# Patient Record
Sex: Female | Born: 1935
Health system: Southern US, Community
[De-identification: ages and names within clinical notes are randomized; demographics above are authoritative.]

## PROBLEM LIST (undated history)

## (undated) DIAGNOSIS — H409 Unspecified glaucoma: Secondary | ICD-10-CM

## (undated) DIAGNOSIS — H5410 Blindness, one eye, low vision other eye, unspecified eyes: Secondary | ICD-10-CM

## (undated) DIAGNOSIS — E785 Hyperlipidemia, unspecified: Secondary | ICD-10-CM

## (undated) DIAGNOSIS — K219 Gastro-esophageal reflux disease without esophagitis: Secondary | ICD-10-CM

## (undated) DIAGNOSIS — H5440 Blindness, one eye, unspecified eye: Secondary | ICD-10-CM

## (undated) DIAGNOSIS — I10 Essential (primary) hypertension: Secondary | ICD-10-CM

## (undated) DIAGNOSIS — H548 Legal blindness, as defined in USA: Secondary | ICD-10-CM

## (undated) HISTORY — DX: Unspecified glaucoma: H40.9

## (undated) HISTORY — PX: CERVIX LESION DESTRUCTION: SHX591

## (undated) HISTORY — DX: Essential (primary) hypertension: I10

## (undated) HISTORY — DX: Legal blindness, as defined in USA: H54.8

## (undated) HISTORY — PX: APPENDECTOMY: SHX54

## (undated) HISTORY — DX: Hyperlipidemia, unspecified: E78.5

## (undated) HISTORY — DX: Gastro-esophageal reflux disease without esophagitis: K21.9

---
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
## 2002-06-12 HISTORY — PX: ESOPHAGOGASTRODUODENOSCOPY: SHX1529

## 2002-06-12 HISTORY — PX: PARTIAL COLECTOMY: SHX5273

## 2010-10-03 ENCOUNTER — Ambulatory Visit: Payer: Self-pay | Admitting: Internal Medicine

## 2011-01-16 ENCOUNTER — Ambulatory Visit: Payer: Self-pay

## 2011-08-22 DIAGNOSIS — L570 Actinic keratosis: Secondary | ICD-10-CM | POA: Diagnosis not present

## 2011-08-22 DIAGNOSIS — D239 Other benign neoplasm of skin, unspecified: Secondary | ICD-10-CM | POA: Diagnosis not present

## 2011-08-22 DIAGNOSIS — D235 Other benign neoplasm of skin of trunk: Secondary | ICD-10-CM | POA: Diagnosis not present

## 2011-08-22 DIAGNOSIS — L821 Other seborrheic keratosis: Secondary | ICD-10-CM | POA: Diagnosis not present

## 2011-08-22 DIAGNOSIS — Z85828 Personal history of other malignant neoplasm of skin: Secondary | ICD-10-CM | POA: Diagnosis not present

## 2012-02-09 DIAGNOSIS — K219 Gastro-esophageal reflux disease without esophagitis: Secondary | ICD-10-CM | POA: Diagnosis not present

## 2012-02-09 DIAGNOSIS — J329 Chronic sinusitis, unspecified: Secondary | ICD-10-CM | POA: Diagnosis not present

## 2012-03-01 DIAGNOSIS — H4011X0 Primary open-angle glaucoma, stage unspecified: Secondary | ICD-10-CM | POA: Diagnosis not present

## 2012-04-15 IMAGING — CR DG CHEST 2V
1 series · 2 of 2 positions shown · non-contrast
Comparison: none

REASON FOR EXAM: pain
COMMENTS:

[Series 1: view not recorded · 0.17mm/px · 2 of 2 slices shown]
[im 1/2]
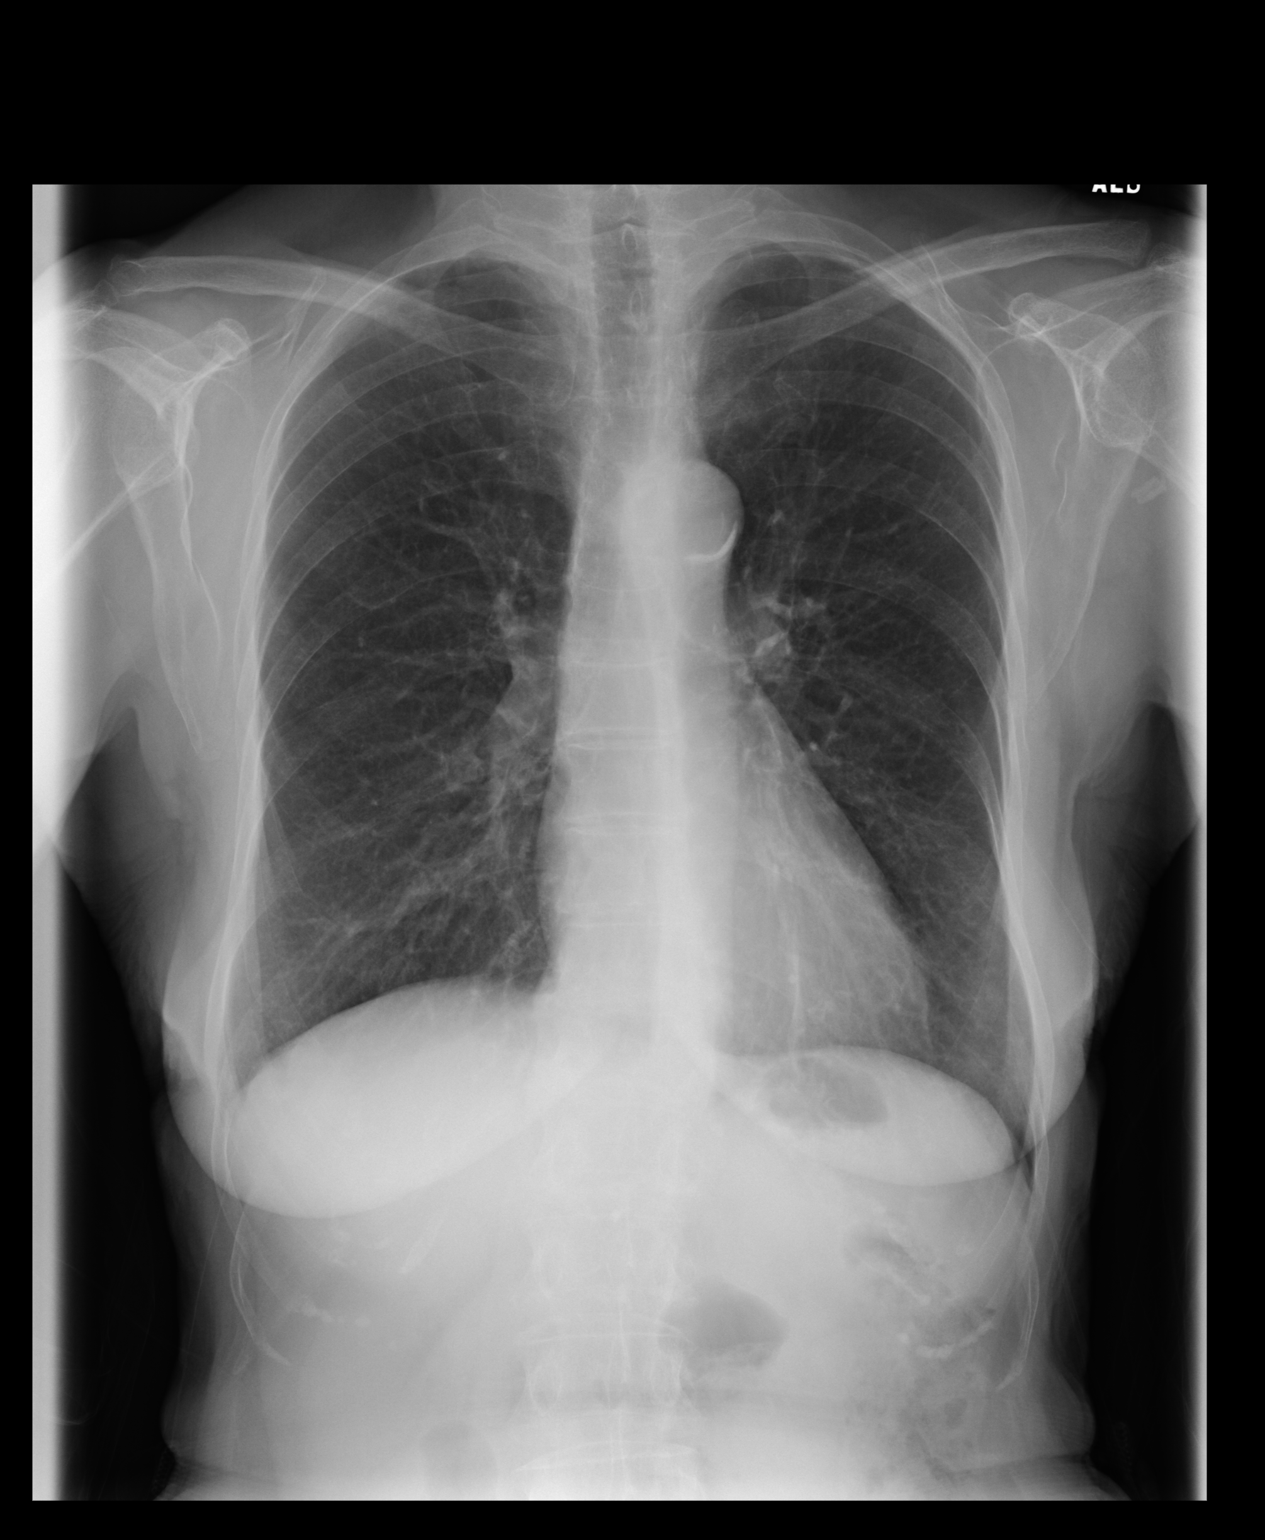
[im 2/2]
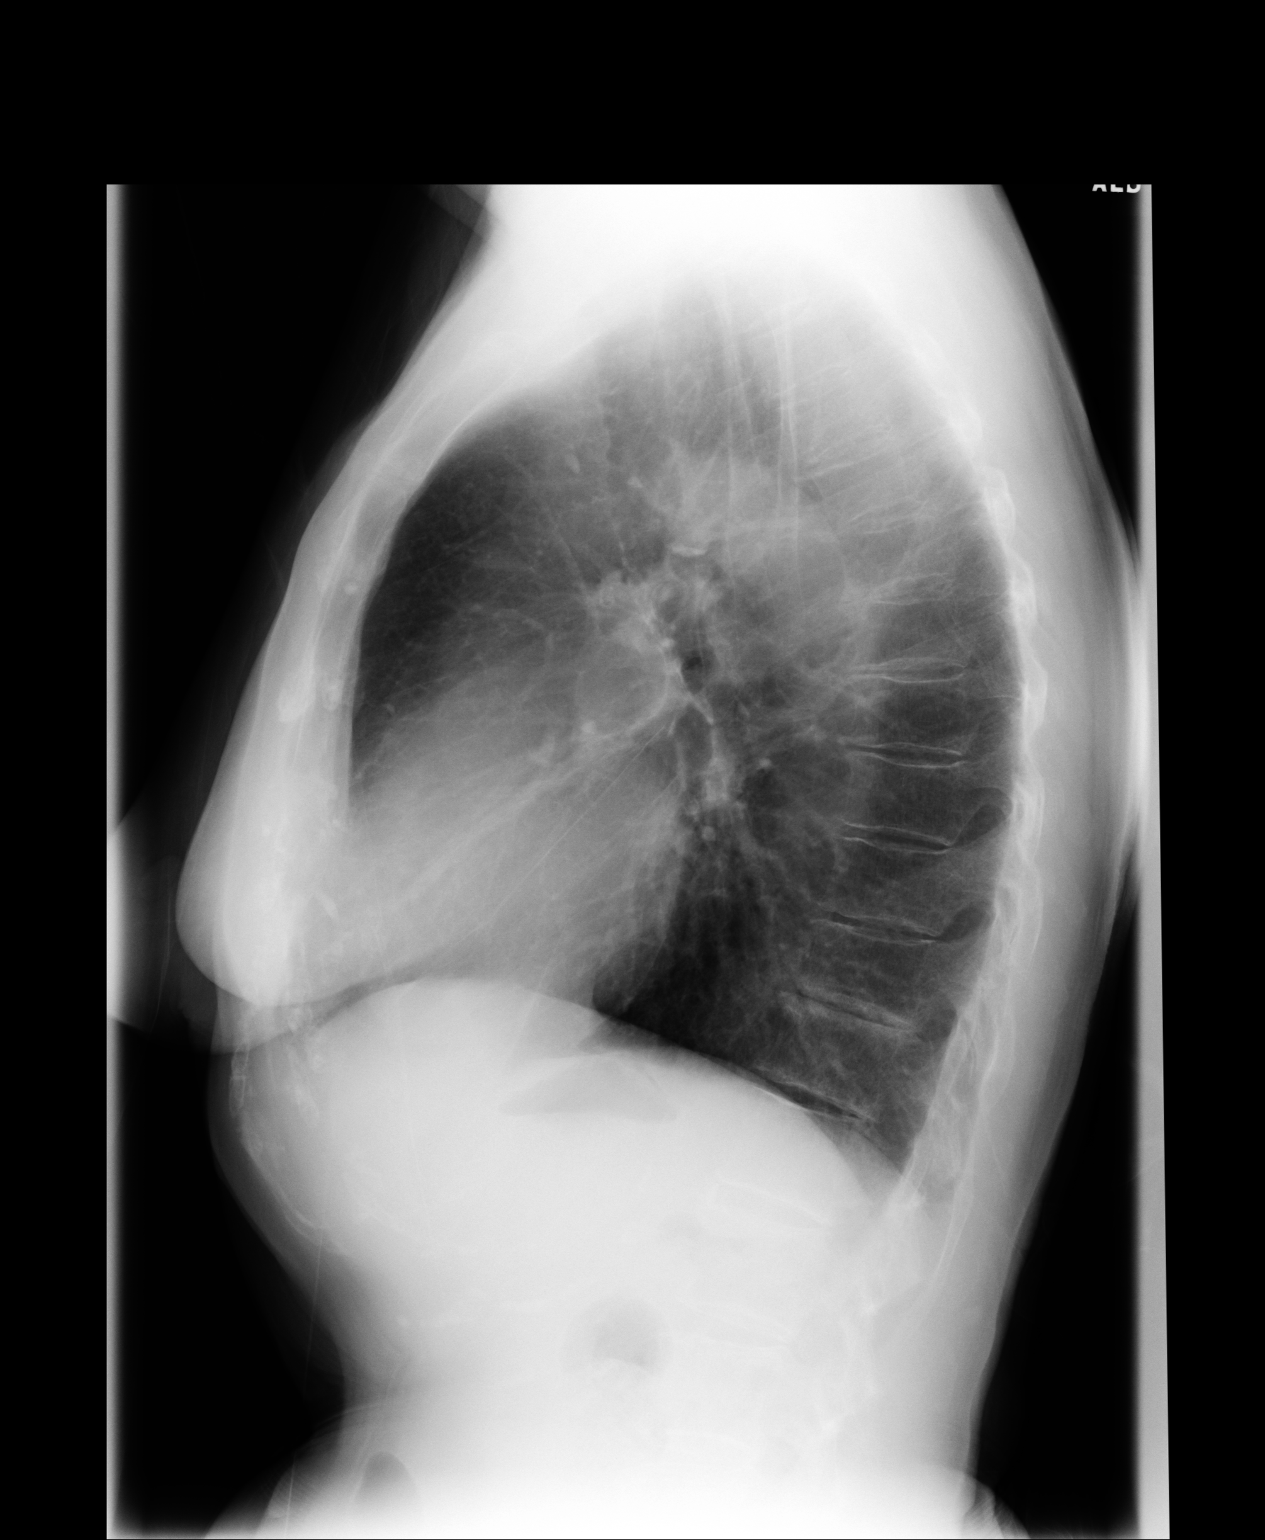

[2 of 2 positions shown; findings below may reference images not displayed]

PROCEDURE:     MDR - MDR CHEST PA(OR AP) AND LATERAL  - October 03, 2010 [DATE]

RESULT:     Calcification is seen within the aortic arch. The lungs are
hyperinflated consistent with COPD. The heart is normal in size. There is no
edema, infiltrate, effusion or pneumothorax. The bony and mediastinal
structures are unremarkable.
IMPRESSION: 1. Atherosclerotic disease.
2. No acute cardiopulmonary disease evident.

## 2012-05-22 DIAGNOSIS — H4011X0 Primary open-angle glaucoma, stage unspecified: Secondary | ICD-10-CM | POA: Diagnosis not present

## 2012-05-24 DIAGNOSIS — H35359 Cystoid macular degeneration, unspecified eye: Secondary | ICD-10-CM | POA: Diagnosis not present

## 2012-05-24 DIAGNOSIS — H349 Unspecified retinal vascular occlusion: Secondary | ICD-10-CM | POA: Diagnosis not present

## 2012-05-24 DIAGNOSIS — H409 Unspecified glaucoma: Secondary | ICD-10-CM | POA: Diagnosis not present

## 2012-05-24 DIAGNOSIS — H4010X0 Unspecified open-angle glaucoma, stage unspecified: Secondary | ICD-10-CM | POA: Diagnosis not present

## 2012-05-27 DIAGNOSIS — H348192 Central retinal vein occlusion, unspecified eye, stable: Secondary | ICD-10-CM | POA: Diagnosis not present

## 2012-06-10 DIAGNOSIS — H35039 Hypertensive retinopathy, unspecified eye: Secondary | ICD-10-CM | POA: Diagnosis not present

## 2012-06-10 DIAGNOSIS — H43819 Vitreous degeneration, unspecified eye: Secondary | ICD-10-CM | POA: Diagnosis not present

## 2012-06-10 DIAGNOSIS — H35359 Cystoid macular degeneration, unspecified eye: Secondary | ICD-10-CM | POA: Diagnosis not present

## 2012-06-10 DIAGNOSIS — H409 Unspecified glaucoma: Secondary | ICD-10-CM | POA: Diagnosis not present

## 2012-06-10 DIAGNOSIS — H4010X0 Unspecified open-angle glaucoma, stage unspecified: Secondary | ICD-10-CM | POA: Diagnosis not present

## 2012-06-10 DIAGNOSIS — H348192 Central retinal vein occlusion, unspecified eye, stable: Secondary | ICD-10-CM | POA: Diagnosis not present

## 2012-06-10 DIAGNOSIS — H349 Unspecified retinal vascular occlusion: Secondary | ICD-10-CM | POA: Diagnosis not present

## 2012-06-25 DIAGNOSIS — K219 Gastro-esophageal reflux disease without esophagitis: Secondary | ICD-10-CM | POA: Diagnosis not present

## 2012-06-25 DIAGNOSIS — Z0000 Encounter for general adult medical examination without abnormal findings: Secondary | ICD-10-CM | POA: Diagnosis not present

## 2012-06-25 DIAGNOSIS — E7800 Pure hypercholesterolemia, unspecified: Secondary | ICD-10-CM | POA: Diagnosis not present

## 2012-06-25 DIAGNOSIS — I10 Essential (primary) hypertension: Secondary | ICD-10-CM | POA: Diagnosis not present

## 2012-07-08 DIAGNOSIS — H35359 Cystoid macular degeneration, unspecified eye: Secondary | ICD-10-CM | POA: Diagnosis not present

## 2012-07-08 DIAGNOSIS — H348192 Central retinal vein occlusion, unspecified eye, stable: Secondary | ICD-10-CM | POA: Diagnosis not present

## 2012-08-07 DIAGNOSIS — H348192 Central retinal vein occlusion, unspecified eye, stable: Secondary | ICD-10-CM | POA: Diagnosis not present

## 2012-09-04 DIAGNOSIS — H348192 Central retinal vein occlusion, unspecified eye, stable: Secondary | ICD-10-CM | POA: Diagnosis not present

## 2012-09-04 DIAGNOSIS — H35359 Cystoid macular degeneration, unspecified eye: Secondary | ICD-10-CM | POA: Diagnosis not present

## 2012-09-05 DIAGNOSIS — H348192 Central retinal vein occlusion, unspecified eye, stable: Secondary | ICD-10-CM | POA: Diagnosis not present

## 2012-09-25 DIAGNOSIS — L570 Actinic keratosis: Secondary | ICD-10-CM | POA: Diagnosis not present

## 2012-09-25 DIAGNOSIS — L819 Disorder of pigmentation, unspecified: Secondary | ICD-10-CM | POA: Diagnosis not present

## 2012-09-25 DIAGNOSIS — L821 Other seborrheic keratosis: Secondary | ICD-10-CM | POA: Diagnosis not present

## 2012-09-25 DIAGNOSIS — L738 Other specified follicular disorders: Secondary | ICD-10-CM | POA: Diagnosis not present

## 2012-10-09 DIAGNOSIS — H409 Unspecified glaucoma: Secondary | ICD-10-CM | POA: Diagnosis not present

## 2012-10-09 DIAGNOSIS — H35359 Cystoid macular degeneration, unspecified eye: Secondary | ICD-10-CM | POA: Diagnosis not present

## 2012-10-09 DIAGNOSIS — H4010X0 Unspecified open-angle glaucoma, stage unspecified: Secondary | ICD-10-CM | POA: Diagnosis not present

## 2012-10-09 DIAGNOSIS — H349 Unspecified retinal vascular occlusion: Secondary | ICD-10-CM | POA: Diagnosis not present

## 2012-10-16 DIAGNOSIS — H35359 Cystoid macular degeneration, unspecified eye: Secondary | ICD-10-CM | POA: Diagnosis not present

## 2012-10-16 DIAGNOSIS — H348192 Central retinal vein occlusion, unspecified eye, stable: Secondary | ICD-10-CM | POA: Diagnosis not present

## 2012-11-18 DIAGNOSIS — I10 Essential (primary) hypertension: Secondary | ICD-10-CM | POA: Diagnosis not present

## 2012-12-04 DIAGNOSIS — H348192 Central retinal vein occlusion, unspecified eye, stable: Secondary | ICD-10-CM | POA: Diagnosis not present

## 2012-12-04 DIAGNOSIS — H35359 Cystoid macular degeneration, unspecified eye: Secondary | ICD-10-CM | POA: Diagnosis not present

## 2012-12-05 ENCOUNTER — Ambulatory Visit: Payer: Self-pay | Admitting: Internal Medicine

## 2012-12-05 DIAGNOSIS — Z1231 Encounter for screening mammogram for malignant neoplasm of breast: Secondary | ICD-10-CM | POA: Diagnosis not present

## 2012-12-05 LAB — HM MAMMOGRAPHY: HM Mammogram: NORMAL

## 2013-01-08 DIAGNOSIS — H409 Unspecified glaucoma: Secondary | ICD-10-CM | POA: Diagnosis not present

## 2013-01-08 DIAGNOSIS — H4010X0 Unspecified open-angle glaucoma, stage unspecified: Secondary | ICD-10-CM | POA: Diagnosis not present

## 2013-01-08 DIAGNOSIS — H349 Unspecified retinal vascular occlusion: Secondary | ICD-10-CM | POA: Diagnosis not present

## 2013-01-08 DIAGNOSIS — H2510 Age-related nuclear cataract, unspecified eye: Secondary | ICD-10-CM | POA: Diagnosis not present

## 2013-01-29 DIAGNOSIS — H2510 Age-related nuclear cataract, unspecified eye: Secondary | ICD-10-CM | POA: Diagnosis not present

## 2013-01-29 DIAGNOSIS — H348192 Central retinal vein occlusion, unspecified eye, stable: Secondary | ICD-10-CM | POA: Diagnosis not present

## 2013-01-29 DIAGNOSIS — H35039 Hypertensive retinopathy, unspecified eye: Secondary | ICD-10-CM | POA: Diagnosis not present

## 2013-02-17 DIAGNOSIS — I10 Essential (primary) hypertension: Secondary | ICD-10-CM | POA: Diagnosis not present

## 2013-03-14 DIAGNOSIS — H5710 Ocular pain, unspecified eye: Secondary | ICD-10-CM | POA: Diagnosis not present

## 2013-03-14 DIAGNOSIS — H348192 Central retinal vein occlusion, unspecified eye, stable: Secondary | ICD-10-CM | POA: Diagnosis not present

## 2013-03-14 DIAGNOSIS — H4050X0 Glaucoma secondary to other eye disorders, unspecified eye, stage unspecified: Secondary | ICD-10-CM | POA: Diagnosis not present

## 2013-03-17 DIAGNOSIS — H348192 Central retinal vein occlusion, unspecified eye, stable: Secondary | ICD-10-CM | POA: Diagnosis not present

## 2013-03-17 DIAGNOSIS — H4050X0 Glaucoma secondary to other eye disorders, unspecified eye, stage unspecified: Secondary | ICD-10-CM | POA: Diagnosis not present

## 2013-03-19 DIAGNOSIS — H4089 Other specified glaucoma: Secondary | ICD-10-CM | POA: Diagnosis not present

## 2013-03-19 DIAGNOSIS — H35039 Hypertensive retinopathy, unspecified eye: Secondary | ICD-10-CM | POA: Diagnosis not present

## 2013-03-19 DIAGNOSIS — H2510 Age-related nuclear cataract, unspecified eye: Secondary | ICD-10-CM | POA: Diagnosis not present

## 2013-03-19 DIAGNOSIS — H348192 Central retinal vein occlusion, unspecified eye, stable: Secondary | ICD-10-CM | POA: Diagnosis not present

## 2013-03-20 DIAGNOSIS — H4089 Other specified glaucoma: Secondary | ICD-10-CM | POA: Diagnosis not present

## 2013-03-20 DIAGNOSIS — H259 Unspecified age-related cataract: Secondary | ICD-10-CM | POA: Diagnosis not present

## 2013-03-20 DIAGNOSIS — H4011X0 Primary open-angle glaucoma, stage unspecified: Secondary | ICD-10-CM | POA: Diagnosis not present

## 2013-03-20 DIAGNOSIS — H348192 Central retinal vein occlusion, unspecified eye, stable: Secondary | ICD-10-CM | POA: Diagnosis not present

## 2013-03-21 DIAGNOSIS — R110 Nausea: Secondary | ICD-10-CM | POA: Diagnosis not present

## 2013-03-21 DIAGNOSIS — H4011X0 Primary open-angle glaucoma, stage unspecified: Secondary | ICD-10-CM | POA: Diagnosis not present

## 2013-03-21 DIAGNOSIS — H4089 Other specified glaucoma: Secondary | ICD-10-CM | POA: Diagnosis not present

## 2013-03-21 DIAGNOSIS — H409 Unspecified glaucoma: Secondary | ICD-10-CM | POA: Diagnosis not present

## 2013-03-21 DIAGNOSIS — H348192 Central retinal vein occlusion, unspecified eye, stable: Secondary | ICD-10-CM | POA: Diagnosis not present

## 2013-03-21 DIAGNOSIS — I498 Other specified cardiac arrhythmias: Secondary | ICD-10-CM | POA: Diagnosis not present

## 2013-03-21 DIAGNOSIS — H259 Unspecified age-related cataract: Secondary | ICD-10-CM | POA: Diagnosis not present

## 2013-03-26 DIAGNOSIS — H35039 Hypertensive retinopathy, unspecified eye: Secondary | ICD-10-CM | POA: Diagnosis not present

## 2013-03-26 DIAGNOSIS — H4089 Other specified glaucoma: Secondary | ICD-10-CM | POA: Diagnosis not present

## 2013-03-26 DIAGNOSIS — H348192 Central retinal vein occlusion, unspecified eye, stable: Secondary | ICD-10-CM | POA: Diagnosis not present

## 2013-03-26 DIAGNOSIS — H2510 Age-related nuclear cataract, unspecified eye: Secondary | ICD-10-CM | POA: Diagnosis not present

## 2013-04-09 DIAGNOSIS — H4089 Other specified glaucoma: Secondary | ICD-10-CM | POA: Diagnosis not present

## 2013-04-16 DIAGNOSIS — H348192 Central retinal vein occlusion, unspecified eye, stable: Secondary | ICD-10-CM | POA: Diagnosis not present

## 2013-04-16 DIAGNOSIS — H4089 Other specified glaucoma: Secondary | ICD-10-CM | POA: Diagnosis not present

## 2013-05-14 DIAGNOSIS — H348192 Central retinal vein occlusion, unspecified eye, stable: Secondary | ICD-10-CM | POA: Diagnosis not present

## 2013-05-14 DIAGNOSIS — H4089 Other specified glaucoma: Secondary | ICD-10-CM | POA: Diagnosis not present

## 2013-06-11 DIAGNOSIS — H348192 Central retinal vein occlusion, unspecified eye, stable: Secondary | ICD-10-CM | POA: Diagnosis not present

## 2013-06-11 DIAGNOSIS — H4089 Other specified glaucoma: Secondary | ICD-10-CM | POA: Diagnosis not present

## 2013-07-02 DIAGNOSIS — H2510 Age-related nuclear cataract, unspecified eye: Secondary | ICD-10-CM | POA: Diagnosis not present

## 2013-07-02 DIAGNOSIS — H4010X0 Unspecified open-angle glaucoma, stage unspecified: Secondary | ICD-10-CM | POA: Diagnosis not present

## 2013-07-02 DIAGNOSIS — H4089 Other specified glaucoma: Secondary | ICD-10-CM | POA: Diagnosis not present

## 2013-07-02 DIAGNOSIS — H409 Unspecified glaucoma: Secondary | ICD-10-CM | POA: Diagnosis not present

## 2013-07-11 DIAGNOSIS — H348192 Central retinal vein occlusion, unspecified eye, stable: Secondary | ICD-10-CM | POA: Diagnosis not present

## 2013-07-11 DIAGNOSIS — H4089 Other specified glaucoma: Secondary | ICD-10-CM | POA: Diagnosis not present

## 2013-07-11 DIAGNOSIS — H4011X0 Primary open-angle glaucoma, stage unspecified: Secondary | ICD-10-CM | POA: Diagnosis not present

## 2013-07-25 DIAGNOSIS — H4089 Other specified glaucoma: Secondary | ICD-10-CM | POA: Diagnosis not present

## 2013-07-25 DIAGNOSIS — H348192 Central retinal vein occlusion, unspecified eye, stable: Secondary | ICD-10-CM | POA: Diagnosis not present

## 2013-08-18 DIAGNOSIS — Z0000 Encounter for general adult medical examination without abnormal findings: Secondary | ICD-10-CM | POA: Diagnosis not present

## 2013-08-18 DIAGNOSIS — C189 Malignant neoplasm of colon, unspecified: Secondary | ICD-10-CM | POA: Diagnosis not present

## 2013-08-18 DIAGNOSIS — I10 Essential (primary) hypertension: Secondary | ICD-10-CM | POA: Diagnosis not present

## 2013-08-18 DIAGNOSIS — K219 Gastro-esophageal reflux disease without esophagitis: Secondary | ICD-10-CM | POA: Diagnosis not present

## 2013-10-21 DIAGNOSIS — L570 Actinic keratosis: Secondary | ICD-10-CM | POA: Diagnosis not present

## 2013-10-21 DIAGNOSIS — L821 Other seborrheic keratosis: Secondary | ICD-10-CM | POA: Diagnosis not present

## 2013-10-21 DIAGNOSIS — L738 Other specified follicular disorders: Secondary | ICD-10-CM | POA: Diagnosis not present

## 2013-10-21 DIAGNOSIS — L819 Disorder of pigmentation, unspecified: Secondary | ICD-10-CM | POA: Diagnosis not present

## 2013-10-27 DIAGNOSIS — H2510 Age-related nuclear cataract, unspecified eye: Secondary | ICD-10-CM | POA: Diagnosis not present

## 2013-10-27 DIAGNOSIS — H4089 Other specified glaucoma: Secondary | ICD-10-CM | POA: Diagnosis not present

## 2013-10-27 DIAGNOSIS — H4010X0 Unspecified open-angle glaucoma, stage unspecified: Secondary | ICD-10-CM | POA: Diagnosis not present

## 2013-10-27 DIAGNOSIS — H409 Unspecified glaucoma: Secondary | ICD-10-CM | POA: Diagnosis not present

## 2013-11-20 DIAGNOSIS — H348192 Central retinal vein occlusion, unspecified eye, stable: Secondary | ICD-10-CM | POA: Diagnosis not present

## 2013-11-20 DIAGNOSIS — H4089 Other specified glaucoma: Secondary | ICD-10-CM | POA: Diagnosis not present

## 2013-11-20 DIAGNOSIS — H4011X0 Primary open-angle glaucoma, stage unspecified: Secondary | ICD-10-CM | POA: Diagnosis not present

## 2014-02-19 DIAGNOSIS — H409 Unspecified glaucoma: Secondary | ICD-10-CM | POA: Diagnosis not present

## 2014-02-19 DIAGNOSIS — H4089 Other specified glaucoma: Secondary | ICD-10-CM | POA: Diagnosis not present

## 2014-02-19 DIAGNOSIS — H4010X0 Unspecified open-angle glaucoma, stage unspecified: Secondary | ICD-10-CM | POA: Diagnosis not present

## 2014-02-19 DIAGNOSIS — H2510 Age-related nuclear cataract, unspecified eye: Secondary | ICD-10-CM | POA: Diagnosis not present

## 2014-03-24 DIAGNOSIS — H4011X0 Primary open-angle glaucoma, stage unspecified: Secondary | ICD-10-CM | POA: Diagnosis not present

## 2014-03-24 DIAGNOSIS — H34812 Central retinal vein occlusion, left eye: Secondary | ICD-10-CM | POA: Diagnosis not present

## 2014-03-24 DIAGNOSIS — H4089 Other specified glaucoma: Secondary | ICD-10-CM | POA: Diagnosis not present

## 2014-06-19 DIAGNOSIS — H349 Unspecified retinal vascular occlusion: Secondary | ICD-10-CM | POA: Diagnosis not present

## 2014-06-19 DIAGNOSIS — H2513 Age-related nuclear cataract, bilateral: Secondary | ICD-10-CM | POA: Diagnosis not present

## 2014-06-19 DIAGNOSIS — H4089 Other specified glaucoma: Secondary | ICD-10-CM | POA: Diagnosis not present

## 2014-06-19 DIAGNOSIS — H4011X3 Primary open-angle glaucoma, severe stage: Secondary | ICD-10-CM | POA: Diagnosis not present

## 2014-07-02 DIAGNOSIS — H4089 Other specified glaucoma: Secondary | ICD-10-CM | POA: Diagnosis not present

## 2014-07-02 DIAGNOSIS — H349 Unspecified retinal vascular occlusion: Secondary | ICD-10-CM | POA: Diagnosis not present

## 2014-07-02 DIAGNOSIS — H4011X3 Primary open-angle glaucoma, severe stage: Secondary | ICD-10-CM | POA: Diagnosis not present

## 2014-07-02 DIAGNOSIS — H2513 Age-related nuclear cataract, bilateral: Secondary | ICD-10-CM | POA: Diagnosis not present

## 2014-07-10 DIAGNOSIS — H4011X3 Primary open-angle glaucoma, severe stage: Secondary | ICD-10-CM | POA: Diagnosis not present

## 2014-07-10 DIAGNOSIS — H2513 Age-related nuclear cataract, bilateral: Secondary | ICD-10-CM | POA: Diagnosis not present

## 2014-07-10 DIAGNOSIS — H4089 Other specified glaucoma: Secondary | ICD-10-CM | POA: Diagnosis not present

## 2014-07-10 DIAGNOSIS — H349 Unspecified retinal vascular occlusion: Secondary | ICD-10-CM | POA: Diagnosis not present

## 2014-07-17 DIAGNOSIS — H4011X3 Primary open-angle glaucoma, severe stage: Secondary | ICD-10-CM | POA: Diagnosis not present

## 2014-07-17 DIAGNOSIS — H2513 Age-related nuclear cataract, bilateral: Secondary | ICD-10-CM | POA: Diagnosis not present

## 2014-07-17 DIAGNOSIS — H349 Unspecified retinal vascular occlusion: Secondary | ICD-10-CM | POA: Diagnosis not present

## 2014-07-17 DIAGNOSIS — H4089 Other specified glaucoma: Secondary | ICD-10-CM | POA: Diagnosis not present

## 2014-07-30 DIAGNOSIS — H4011X0 Primary open-angle glaucoma, stage unspecified: Secondary | ICD-10-CM | POA: Diagnosis not present

## 2014-07-30 DIAGNOSIS — H34812 Central retinal vein occlusion, left eye: Secondary | ICD-10-CM | POA: Diagnosis not present

## 2014-07-30 DIAGNOSIS — H4089 Other specified glaucoma: Secondary | ICD-10-CM | POA: Diagnosis not present

## 2014-08-25 DIAGNOSIS — H34812 Central retinal vein occlusion, left eye: Secondary | ICD-10-CM | POA: Diagnosis not present

## 2014-08-25 DIAGNOSIS — C189 Malignant neoplasm of colon, unspecified: Secondary | ICD-10-CM | POA: Diagnosis not present

## 2014-08-25 DIAGNOSIS — K219 Gastro-esophageal reflux disease without esophagitis: Secondary | ICD-10-CM | POA: Diagnosis not present

## 2014-08-25 DIAGNOSIS — Z0000 Encounter for general adult medical examination without abnormal findings: Secondary | ICD-10-CM | POA: Diagnosis not present

## 2014-08-25 DIAGNOSIS — R7989 Other specified abnormal findings of blood chemistry: Secondary | ICD-10-CM | POA: Diagnosis not present

## 2014-08-25 DIAGNOSIS — I10 Essential (primary) hypertension: Secondary | ICD-10-CM | POA: Diagnosis not present

## 2014-08-25 DIAGNOSIS — E780 Pure hypercholesterolemia: Secondary | ICD-10-CM | POA: Diagnosis not present

## 2014-08-25 DIAGNOSIS — H5410 Blindness, one eye, low vision other eye, unspecified eyes: Secondary | ICD-10-CM | POA: Diagnosis not present

## 2014-08-25 LAB — LIPID PANEL
CHOLESTEROL: 220 mg/dL — AB (ref 0–200)
HDL: 100 mg/dL — AB (ref 35–70)
LDL CALC: 104 mg/dL
Triglycerides: 80 mg/dL (ref 40–160)

## 2014-08-25 LAB — CBC AND DIFFERENTIAL: Hemoglobin: 13.2 g/dL (ref 12.0–16.0)

## 2014-08-25 LAB — BASIC METABOLIC PANEL
BUN: 15 mg/dL (ref 4–21)
Creatinine: 0.7 mg/dL (ref <=1.1)

## 2014-08-25 LAB — TSH: TSH: 5.8 u[IU]/mL (ref <=5.90)

## 2014-11-30 DIAGNOSIS — H4011X2 Primary open-angle glaucoma, moderate stage: Secondary | ICD-10-CM | POA: Diagnosis not present

## 2014-11-30 DIAGNOSIS — H34812 Central retinal vein occlusion, left eye: Secondary | ICD-10-CM | POA: Diagnosis not present

## 2014-11-30 DIAGNOSIS — H4089 Other specified glaucoma: Secondary | ICD-10-CM | POA: Diagnosis not present

## 2014-12-29 DIAGNOSIS — H43813 Vitreous degeneration, bilateral: Secondary | ICD-10-CM | POA: Diagnosis not present

## 2014-12-29 DIAGNOSIS — H35033 Hypertensive retinopathy, bilateral: Secondary | ICD-10-CM | POA: Diagnosis not present

## 2014-12-29 DIAGNOSIS — H34812 Central retinal vein occlusion, left eye: Secondary | ICD-10-CM | POA: Diagnosis not present

## 2014-12-29 DIAGNOSIS — H2512 Age-related nuclear cataract, left eye: Secondary | ICD-10-CM | POA: Diagnosis not present

## 2014-12-30 DIAGNOSIS — H2512 Age-related nuclear cataract, left eye: Secondary | ICD-10-CM | POA: Diagnosis not present

## 2014-12-30 DIAGNOSIS — H2000 Unspecified acute and subacute iridocyclitis: Secondary | ICD-10-CM | POA: Diagnosis not present

## 2014-12-30 DIAGNOSIS — H35033 Hypertensive retinopathy, bilateral: Secondary | ICD-10-CM | POA: Diagnosis not present

## 2014-12-30 DIAGNOSIS — H34812 Central retinal vein occlusion, left eye: Secondary | ICD-10-CM | POA: Diagnosis not present

## 2014-12-30 DIAGNOSIS — H43813 Vitreous degeneration, bilateral: Secondary | ICD-10-CM | POA: Diagnosis not present

## 2015-01-25 DIAGNOSIS — H43813 Vitreous degeneration, bilateral: Secondary | ICD-10-CM | POA: Diagnosis not present

## 2015-01-25 DIAGNOSIS — H34812 Central retinal vein occlusion, left eye: Secondary | ICD-10-CM | POA: Diagnosis not present

## 2015-01-25 DIAGNOSIS — H35033 Hypertensive retinopathy, bilateral: Secondary | ICD-10-CM | POA: Diagnosis not present

## 2015-02-02 DIAGNOSIS — L570 Actinic keratosis: Secondary | ICD-10-CM | POA: Diagnosis not present

## 2015-02-02 DIAGNOSIS — D1801 Hemangioma of skin and subcutaneous tissue: Secondary | ICD-10-CM | POA: Diagnosis not present

## 2015-02-02 DIAGNOSIS — L821 Other seborrheic keratosis: Secondary | ICD-10-CM | POA: Diagnosis not present

## 2015-02-02 DIAGNOSIS — L820 Inflamed seborrheic keratosis: Secondary | ICD-10-CM | POA: Diagnosis not present

## 2015-03-15 DIAGNOSIS — H349 Unspecified retinal vascular occlusion: Secondary | ICD-10-CM | POA: Diagnosis not present

## 2015-03-15 DIAGNOSIS — H211X2 Other vascular disorders of iris and ciliary body, left eye: Secondary | ICD-10-CM | POA: Diagnosis not present

## 2015-03-15 DIAGNOSIS — H401113 Primary open-angle glaucoma, right eye, severe stage: Secondary | ICD-10-CM | POA: Diagnosis not present

## 2015-03-15 DIAGNOSIS — H2513 Age-related nuclear cataract, bilateral: Secondary | ICD-10-CM | POA: Diagnosis not present

## 2015-03-24 DIAGNOSIS — H4089 Other specified glaucoma: Secondary | ICD-10-CM | POA: Diagnosis not present

## 2015-03-24 DIAGNOSIS — H2513 Age-related nuclear cataract, bilateral: Secondary | ICD-10-CM | POA: Diagnosis not present

## 2015-03-24 DIAGNOSIS — H401123 Primary open-angle glaucoma, left eye, severe stage: Secondary | ICD-10-CM | POA: Diagnosis not present

## 2015-03-24 DIAGNOSIS — H401112 Primary open-angle glaucoma, right eye, moderate stage: Secondary | ICD-10-CM | POA: Diagnosis not present

## 2015-04-08 ENCOUNTER — Encounter: Payer: Self-pay | Admitting: Internal Medicine

## 2015-04-08 ENCOUNTER — Other Ambulatory Visit: Payer: Self-pay | Admitting: Internal Medicine

## 2015-04-08 DIAGNOSIS — H348192 Central retinal vein occlusion, unspecified eye, stable: Secondary | ICD-10-CM | POA: Insufficient documentation

## 2015-04-08 DIAGNOSIS — H2511 Age-related nuclear cataract, right eye: Secondary | ICD-10-CM | POA: Diagnosis not present

## 2015-04-08 DIAGNOSIS — E7800 Pure hypercholesterolemia, unspecified: Secondary | ICD-10-CM | POA: Insufficient documentation

## 2015-04-08 DIAGNOSIS — R001 Bradycardia, unspecified: Secondary | ICD-10-CM | POA: Insufficient documentation

## 2015-04-08 DIAGNOSIS — Z85038 Personal history of other malignant neoplasm of large intestine: Secondary | ICD-10-CM | POA: Insufficient documentation

## 2015-04-08 DIAGNOSIS — H5410 Blindness, one eye, low vision other eye, unspecified eyes: Secondary | ICD-10-CM

## 2015-04-08 DIAGNOSIS — H409 Unspecified glaucoma: Secondary | ICD-10-CM | POA: Insufficient documentation

## 2015-04-08 DIAGNOSIS — K219 Gastro-esophageal reflux disease without esophagitis: Secondary | ICD-10-CM | POA: Insufficient documentation

## 2015-04-08 DIAGNOSIS — Z8669 Personal history of other diseases of the nervous system and sense organs: Secondary | ICD-10-CM | POA: Insufficient documentation

## 2015-04-08 DIAGNOSIS — I10 Essential (primary) hypertension: Secondary | ICD-10-CM | POA: Insufficient documentation

## 2015-04-08 HISTORY — DX: Blindness, one eye, low vision other eye, unspecified eyes: H54.10

## 2015-06-17 DIAGNOSIS — I2510 Atherosclerotic heart disease of native coronary artery without angina pectoris: Secondary | ICD-10-CM | POA: Diagnosis not present

## 2015-06-17 DIAGNOSIS — H2511 Age-related nuclear cataract, right eye: Secondary | ICD-10-CM | POA: Diagnosis not present

## 2015-06-17 DIAGNOSIS — K219 Gastro-esophageal reflux disease without esophagitis: Secondary | ICD-10-CM | POA: Diagnosis not present

## 2015-06-17 DIAGNOSIS — I10 Essential (primary) hypertension: Secondary | ICD-10-CM | POA: Diagnosis not present

## 2015-06-17 DIAGNOSIS — H409 Unspecified glaucoma: Secondary | ICD-10-CM | POA: Diagnosis not present

## 2015-06-17 DIAGNOSIS — H269 Unspecified cataract: Secondary | ICD-10-CM | POA: Diagnosis not present

## 2015-06-17 DIAGNOSIS — H401111 Primary open-angle glaucoma, right eye, mild stage: Secondary | ICD-10-CM | POA: Diagnosis not present

## 2015-06-17 DIAGNOSIS — Z8673 Personal history of transient ischemic attack (TIA), and cerebral infarction without residual deficits: Secondary | ICD-10-CM | POA: Diagnosis not present

## 2015-06-17 DIAGNOSIS — R609 Edema, unspecified: Secondary | ICD-10-CM | POA: Diagnosis not present

## 2015-06-17 DIAGNOSIS — N289 Disorder of kidney and ureter, unspecified: Secondary | ICD-10-CM | POA: Diagnosis not present

## 2015-06-17 DIAGNOSIS — H01001 Unspecified blepharitis right upper eyelid: Secondary | ICD-10-CM | POA: Diagnosis not present

## 2015-06-17 HISTORY — PX: CATARACT EXTRACTION W/ INTRAOCULAR LENS IMPLANT: SHX1309

## 2015-06-29 DIAGNOSIS — H348121 Central retinal vein occlusion, left eye, with retinal neovascularization: Secondary | ICD-10-CM | POA: Diagnosis not present

## 2015-06-29 DIAGNOSIS — H35033 Hypertensive retinopathy, bilateral: Secondary | ICD-10-CM | POA: Diagnosis not present

## 2015-06-29 DIAGNOSIS — H43813 Vitreous degeneration, bilateral: Secondary | ICD-10-CM | POA: Diagnosis not present

## 2015-08-26 ENCOUNTER — Encounter: Payer: Self-pay | Admitting: Internal Medicine

## 2015-09-03 ENCOUNTER — Encounter: Payer: Self-pay | Admitting: Internal Medicine

## 2015-09-03 ENCOUNTER — Ambulatory Visit (INDEPENDENT_AMBULATORY_CARE_PROVIDER_SITE_OTHER): Payer: Medicare Other | Admitting: Internal Medicine

## 2015-09-03 VITALS — BP 112/64 | HR 60 | Ht 59.0 in | Wt 111.2 lb

## 2015-09-03 DIAGNOSIS — K219 Gastro-esophageal reflux disease without esophagitis: Secondary | ICD-10-CM

## 2015-09-03 DIAGNOSIS — E7800 Pure hypercholesterolemia, unspecified: Secondary | ICD-10-CM | POA: Diagnosis not present

## 2015-09-03 DIAGNOSIS — I10 Essential (primary) hypertension: Secondary | ICD-10-CM

## 2015-09-03 DIAGNOSIS — Z0000 Encounter for general adult medical examination without abnormal findings: Secondary | ICD-10-CM | POA: Diagnosis not present

## 2015-09-03 MED ORDER — LOSARTAN POTASSIUM 50 MG PO TABS: 25.0000 mg | ORAL_TABLET | Freq: Every day | ORAL | Status: DC

## 2015-09-03 NOTE — Progress Notes (Signed)
Patient: Tammy Mcgee, Female    DOB: [birth year], 82 y.o.   MRN: [id number] Visit Date: 09/03/2015  Today's Provider: Halina Maidens, MD   Chief Complaint  Patient presents with  . Medicare Wellness   Subjective:    Annual wellness visit Tammy Mcgee is a 82 y.o. female who presents today for her Subsequent Annual Wellness Visit. She feels well. She reports exercising walking, yard work. She reports she is sleeping well. She no longer gets mammograms.  She declines further colonoscopies.  She declines vaccinations but would consider a TDaP and maybe a Zostavax.  ----------------------------------------------------------- Hypertension This is a chronic problem. The current episode started more than 1 year ago. The problem is unchanged. The problem is controlled. Pertinent negatives include no chest pain, headaches, palpitations or shortness of breath. Past treatments include angiotensin blockers. The current treatment provides significant improvement. There are no compliance problems.   Gastroesophageal Reflux She complains of heartburn (intermittently). She reports no abdominal pain, no chest pain, no coughing or no wheezing. This is a recurrent problem. The problem has been waxing and waning. Pertinent negatives include no fatigue. She has tried a PPI for the symptoms. The treatment provided significant relief.    Review of Systems  Constitutional: Negative for fever, chills and fatigue.  HENT: Negative for congestion, hearing loss, tinnitus, trouble swallowing and voice change.   Eyes: Positive for visual disturbance.  Respiratory: Negative for cough, chest tightness, shortness of breath and wheezing.   Cardiovascular: Negative for chest pain, palpitations and leg swelling.  Gastrointestinal: Positive for heartburn (intermittently), diarrhea and constipation. Negative for vomiting, abdominal pain, blood in stool and rectal pain.  Endocrine: Negative for polydipsia and polyuria.   Genitourinary: Negative for dysuria, frequency, hematuria, vaginal bleeding, vaginal discharge and genital sores.  Musculoskeletal: Positive for neck stiffness. Negative for joint swelling, arthralgias and gait problem.  Skin: Negative for color change and rash.  Neurological: Positive for dizziness. Negative for tremors, syncope, light-headedness, numbness and headaches.  Hematological: Negative for adenopathy. Does not bruise/bleed easily.  Psychiatric/Behavioral: Negative for sleep disturbance and dysphoric mood. The patient is not nervous/anxious.     Social History   Social History  . Marital Status: Divorced    Spouse Name: N/A  . Number of Children: N/A  . Years of Education: N/A   Occupational History  . Not on file.   Social History Main Topics  . Smoking status: Never Smoker   . Smokeless tobacco: Not on file  . Alcohol Use: No  . Drug Use: No  . Sexual Activity: Not on file   Other Topics Concern  . Not on file   Social History Narrative    Patient Active Problem List   Diagnosis Date Noted  . Blindness of one eye with low vision in contralateral eye 04/08/2015  . Bradycardia 04/08/2015  . Central retinal vein occlusion 04/08/2015  . Essential (primary) hypertension 04/08/2015  . Gastro-esophageal reflux disease without esophagitis 04/08/2015  . Glaucoma 04/08/2015  . History of hearing problem 04/08/2015  . Hypercholesterolemia 04/08/2015  . Malignant neoplasm of colon ([place]) 04/08/2015    Past Surgical History  Procedure Laterality Date  . Appendectomy    . Partial colectomy  2004    colon cancer  . Cervix lesion destruction    . Esophagogastroduodenoscopy  2004    HH  . Cataract extraction w/ intraocular lens implant Right 06/17/2015    Her family history includes CAD in her father; Heart attack in her father; Hypertension  in her mother.    Previous Medications   BRIMONIDINE-TIMOLOL (COMBIGAN) 0.2-0.5 % OPHTHALMIC SOLUTION    Apply to eye.    ESOMEPRAZOLE (NEXIUM 24HR) 20 MG CAPSULE    Take 1 capsule by mouth daily.   LOSARTAN (COZAAR) 50 MG TABLET    Take 1 tablet by mouth daily.    Patient Care Team: Glean Hess, MD as PCP - General (Family Medicine)     Objective:   Vitals: BP 112/64 mmHg  Pulse 60  Ht 4\' 11"  (1.499 m)  Wt 111 lb 3.2 oz (50.44 kg)  BMI 22.45 kg/m2  Physical Exam  Constitutional: She is oriented to person, place, and time. She appears well-developed and well-nourished. No distress.  HENT:  Head: Normocephalic and atraumatic.  Right Ear: Tympanic membrane and ear canal normal.  Left Ear: Tympanic membrane and ear canal normal.  Nose: Right sinus exhibits no maxillary sinus tenderness. Left sinus exhibits no maxillary sinus tenderness.  Mouth/Throat: Uvula is midline and oropharynx is clear and moist.  Eyes: Conjunctivae and EOM are normal. Right eye exhibits no discharge. Left eye exhibits no discharge. No scleral icterus.  Blind in left eye  Neck: Normal range of motion. Carotid bruit is not present. No erythema present. No thyromegaly present.  Cardiovascular: Normal rate, regular rhythm, normal heart sounds and normal pulses.   Pulmonary/Chest: Effort normal and breath sounds normal. No respiratory distress. She has no wheezes. Right breast exhibits no mass, no nipple discharge, no skin change and no tenderness. Left breast exhibits no mass, no nipple discharge, no skin change and no tenderness.  Abdominal: Soft. Bowel sounds are normal. There is no hepatosplenomegaly. There is no tenderness. There is no CVA tenderness.  Musculoskeletal: Normal range of motion. She exhibits no edema or tenderness.       Right hip: She exhibits normal range of motion.       Left hip: She exhibits normal range of motion.       Right knee: She exhibits normal range of motion. No tenderness found.       Left knee: She exhibits normal range of motion. No tenderness found.       Cervical back: She exhibits normal  range of motion, no tenderness and no spasm.  Lymphadenopathy:    She has no cervical adenopathy.    She has no axillary adenopathy.  Neurological: She is alert and oriented to person, place, and time. She has normal reflexes. No cranial nerve deficit or sensory deficit.  Skin: Skin is warm, dry and intact. No rash noted.  Psychiatric: She has a normal mood and affect. Her speech is normal and behavior is normal. Thought content normal.  Nursing note and vitals reviewed.   Activities of Daily Living In your present state of health, do you have any difficulty performing the following activities: 09/03/2015  Hearing? Y  Vision? Y  Difficulty concentrating or making decisions? Y  Walking or climbing stairs? N  Dressing or bathing? N  Doing errands, shopping? N    Fall Risk Assessment Fall Risk  09/03/2015  Falls in the past year? Yes  Number falls in past yr: 2 or more  Injury with Fall? No  Follow up Falls evaluation completed      Depression Screen PHQ 2/9 Scores 09/03/2015  PHQ - 2 Score 0    Cognitive Testing - 6-CIT   Correct? Score   What year is it? yes 0 Yes = 0    No = 4  What month is it? yes 0 Yes = 0    No = 3  Remember:     [address], [place], [place]     What time is it? yes 0 Yes = 0    No = 3  Count backwards from 20 to 1 yes 0 Correct = 0    1 error = 2   More than 1 error = 4  Say the months of the year in reverse. yes 0 Correct = 0    1 error = 2   More than 1 error = 4  What address did I ask you to remember? no 4 Correct = 0  1 error = 2    2 error = 4    3 error = 6    4 error = 8    All wrong = 10       TOTAL SCORE  4/28   Interpretation:  Normal  Normal (0-7) Abnormal (8-28)        Medicare Annual Wellness Visit Summary:  Reviewed patient's Family Medical History Reviewed and updated list of patient's medical providers Assessment of cognitive impairment was done Assessed patient's functional ability Established a written schedule  for health screening [place] Completed and Reviewed  Exercise Activities and Dietary recommendations Goals    None       There is no immunization history on file for this patient.  Health Maintenance  Topic Date Due  . TETANUS/TDAP  05/25/1954  . ZOSTAVAX  05/26/1995  . DEXA SCAN  05/25/2000  . PNA vac Low Risk Adult (1 of 2 - PCV13) 05/25/2000  . INFLUENZA VACCINE  09/10/2015 (Originally 01/11/2015)     Discussed health benefits of physical activity, and encouraged her to engage in regular exercise appropriate for her age and condition.    ------------------------------------------------------------------------------------------------------------   Assessment & Plan:  1. Medicare annual wellness visit, subsequent Pt declines all vaccinations today - I did give her a script to get Zostavax if she chooses. No further mammograms Declines colonoscopy even though she's had colon cancer  2. Essential (primary) hypertension controlled - losartan (COZAAR) 50 MG tablet; Take 0.5 tablets (25 mg total) by mouth daily.  Dispense: 30 tablet; Refill: 12 - Comprehensive metabolic panel - TSH  3. Gastro-esophageal reflux disease without esophagitis On PRN nexium - CBC with Differential/Platelet  4. Hypercholesterolemia Will check labs and advise - Lipid panel   Halina Maidens, MD [facility]  09/03/2015

## 2015-09-03 NOTE — Patient Instructions (Addendum)
Health Maintenance  Topic Date Due  . TETANUS/TDAP  05/25/1954  . DEXA SCAN  05/25/2000  . INFLUENZA VACCINE  09/10/2015 (Originally 01/11/2015)  . ZOSTAVAX  09/02/2020 (Originally 05/26/1995)  . PNA vac Low Risk Adult (1 of 2 - PCV13) 09/02/2020 (Originally 05/25/2000)   Breast Self-Awareness Practicing breast self-awareness may pick up problems early, prevent significant medical complications, and possibly save your life. By practicing breast self-awareness, you can become familiar with how your breasts look and feel and if your breasts are changing. This allows you to notice changes early. It can also offer you some reassurance that your breast health is good. One way to learn what is normal for your breasts and whether your breasts are changing is to do a breast self-exam. If you find a lump or something that was not present in the past, it is best to contact your caregiver right away. Other findings that should be evaluated by your caregiver include nipple discharge, especially if it is bloody; skin changes or reddening; areas where the skin seems to be pulled in (retracted); or new lumps and bumps. Breast pain is seldom associated with cancer (malignancy), but should also be evaluated by a caregiver. HOW TO PERFORM A BREAST SELF-EXAM The best time to examine your breasts is 5-7 days after your menstrual period is over. During menstruation, the breasts are lumpier, and it may be more difficult to pick up changes. If you do not menstruate, have reached menopause, or had your uterus removed (hysterectomy), you should examine your breasts at regular intervals, such as monthly. If you are breastfeeding, examine your breasts after a feeding or after using a breast pump. Breast implants do not decrease the risk for lumps or tumors, so continue to perform breast self-exams as recommended. Talk to your caregiver about how to determine the difference between the implant and breast tissue. Also, talk about the  amount of pressure you should use during the exam. Over time, you will become more familiar with the variations of your breasts and more comfortable with the exam. A breast self-exam requires you to remove all your clothes above the waist. 1. Look at your breasts and nipples. Stand in front of a mirror in a room with good lighting. With your hands on your hips, push your hands firmly downward. Look for a difference in shape, contour, and size from one breast to the other (asymmetry). Asymmetry includes puckers, dips, or bumps. Also, look for skin changes, such as reddened or scaly areas on the breasts. Look for nipple changes, such as discharge, dimpling, repositioning, or redness. 2. Carefully feel your breasts. This is best done either in the shower or tub while using soapy water or when flat on your back. Place the arm (on the side of the breast you are examining) above your head. Use the pads (not the fingertips) of your three middle fingers on your opposite hand to feel your breasts. Start in the underarm area and use  inch (2 cm) overlapping circles to feel your breast. Use 3 different levels of pressure (light, medium, and firm pressure) at each circle before moving to the next circle. The light pressure is needed to feel the tissue closest to the skin. The medium pressure will help to feel breast tissue a little deeper, while the firm pressure is needed to feel the tissue close to the ribs. Continue the overlapping circles, moving downward over the breast until you feel your ribs below your breast. Then, move one finger-width towards  the center of the body. Continue to use the  inch (2 cm) overlapping circles to feel your breast as you move slowly up toward the collar bone (clavicle) near the base of the neck. Continue the up and down exam using all 3 pressures until you reach the middle of the chest. Do this with each breast, carefully feeling for lumps or changes. 3.  Keep a written record with  breast changes or normal findings for each breast. By writing this information down, you do not need to depend only on memory for size, tenderness, or location. Write down where you are in your menstrual cycle, if you are still menstruating. Breast tissue can have some lumps or thick tissue. However, see your caregiver if you find anything that concerns you.  SEEK MEDICAL CARE IF:  You see a change in shape, contour, or size of your breasts or nipples.   You see skin changes, such as reddened or scaly areas on the breasts or nipples.   You have an unusual discharge from your nipples.   You feel a new lump or unusually thick areas.    This information is not intended to replace advice given to you by your health care provider. Make sure you discuss any questions you have with your health care provider.   Document Released: 05/29/2005 Document Revised: 05/15/2012 Document Reviewed: 09/13/2011 Elsevier Interactive Patient Education [employer].

## 2015-09-04 LAB — LIPID PANEL
CHOLESTEROL TOTAL: 218 mg/dL — AB (ref 100–199)
Chol/HDL Ratio: 2.2 ratio units (ref 0.0–4.4)
HDL: 100 mg/dL (ref >39)
LDL Calculated: 102 mg/dL — ABNORMAL HIGH (ref 0–99)
TRIGLYCERIDES: 78 mg/dL (ref 0–149)
VLDL Cholesterol Cal: 16 mg/dL (ref 5–40)

## 2015-09-04 LAB — COMPREHENSIVE METABOLIC PANEL
A/G RATIO: 1.7 (ref 1.2–2.2)
ALK PHOS: 118 IU/L — AB (ref 39–117)
ALT: 9 IU/L (ref 0–32)
AST: 22 IU/L (ref 0–40)
Albumin: 4.3 g/dL (ref 3.5–4.7)
BILIRUBIN TOTAL: 0.5 mg/dL (ref 0.0–1.2)
BUN/Creatinine Ratio: 15 (ref 11–26)
BUN: 11 mg/dL (ref 8–27)
CALCIUM: 9.4 mg/dL (ref 8.7–10.3)
CHLORIDE: 101 mmol/L (ref 96–106)
CO2: 26 mmol/L (ref 18–29)
Creatinine, Ser: 0.73 mg/dL (ref 0.57–1.00)
GFR calc Af Amer: 90 mL/min/{1.73_m2} (ref >59)
GFR calc non Af Amer: 78 mL/min/{1.73_m2} (ref >59)
Globulin, Total: 2.6 g/dL (ref 1.5–4.5)
Glucose: 94 mg/dL (ref 65–99)
POTASSIUM: 4.1 mmol/L (ref 3.5–5.2)
Sodium: 142 mmol/L (ref 134–144)
Total Protein: 6.9 g/dL (ref 6.0–8.5)

## 2015-09-04 LAB — CBC WITH DIFFERENTIAL/PLATELET
BASOS ABS: 0 10*3/uL (ref 0.0–0.2)
Basos: 0 %
EOS (ABSOLUTE): 0.1 10*3/uL (ref 0.0–0.4)
Eos: 2 %
Hematocrit: 38 % (ref 34.0–46.6)
Hemoglobin: 12.8 g/dL (ref 11.1–15.9)
IMMATURE GRANULOCYTES: 0 %
Immature Grans (Abs): 0 10*3/uL (ref 0.0–0.1)
LYMPHS ABS: 1.6 10*3/uL (ref 0.7–3.1)
Lymphs: 22 %
MCH: 30.4 pg (ref 26.6–33.0)
MCHC: 33.7 g/dL (ref 31.5–35.7)
MCV: 90 fL (ref 79–97)
MONOCYTES: 7 %
MONOS ABS: 0.6 10*3/uL (ref 0.1–0.9)
NEUTROS PCT: 69 %
Neutrophils Absolute: 5.2 10*3/uL (ref 1.4–7.0)
Platelets: 224 10*3/uL (ref 150–379)
RBC: 4.21 x10E6/uL (ref 3.77–5.28)
RDW: 13.2 % (ref 12.3–15.4)
WBC: 7.5 10*3/uL (ref 3.4–10.8)

## 2015-09-04 LAB — TSH: TSH: 3.58 u[IU]/mL (ref 0.450–4.500)

## 2015-09-06 ENCOUNTER — Telehealth: Payer: Self-pay

## 2015-09-06 NOTE — Telephone Encounter (Signed)
Spoke with patient. Patient advised of all results and verbalized understanding. Will call back with any future questions or concerns. MA[facility]  

## 2015-09-06 NOTE — Telephone Encounter (Signed)
-----   Message from Glean Hess, MD sent at 09/04/2015 12:52 PM EDT ----- Labs are all normal.

## 2015-09-14 DIAGNOSIS — H2512 Age-related nuclear cataract, left eye: Secondary | ICD-10-CM | POA: Diagnosis not present

## 2015-09-14 DIAGNOSIS — H04123 Dry eye syndrome of bilateral lacrimal glands: Secondary | ICD-10-CM | POA: Diagnosis not present

## 2015-09-14 DIAGNOSIS — H401131 Primary open-angle glaucoma, bilateral, mild stage: Secondary | ICD-10-CM | POA: Diagnosis not present

## 2015-09-14 DIAGNOSIS — H4089 Other specified glaucoma: Secondary | ICD-10-CM | POA: Diagnosis not present

## 2015-09-28 DIAGNOSIS — H04123 Dry eye syndrome of bilateral lacrimal glands: Secondary | ICD-10-CM | POA: Diagnosis not present

## 2015-10-06 DIAGNOSIS — H401123 Primary open-angle glaucoma, left eye, severe stage: Secondary | ICD-10-CM | POA: Diagnosis not present

## 2015-10-06 DIAGNOSIS — H401112 Primary open-angle glaucoma, right eye, moderate stage: Secondary | ICD-10-CM | POA: Diagnosis not present

## 2015-10-06 DIAGNOSIS — Z961 Presence of intraocular lens: Secondary | ICD-10-CM | POA: Diagnosis not present

## 2015-10-06 DIAGNOSIS — H4089 Other specified glaucoma: Secondary | ICD-10-CM | POA: Diagnosis not present

## 2015-10-06 DIAGNOSIS — H259 Unspecified age-related cataract: Secondary | ICD-10-CM | POA: Diagnosis not present

## 2015-10-15 DIAGNOSIS — H401131 Primary open-angle glaucoma, bilateral, mild stage: Secondary | ICD-10-CM | POA: Diagnosis not present

## 2015-10-15 DIAGNOSIS — H04123 Dry eye syndrome of bilateral lacrimal glands: Secondary | ICD-10-CM | POA: Diagnosis not present

## 2015-10-15 DIAGNOSIS — H2512 Age-related nuclear cataract, left eye: Secondary | ICD-10-CM | POA: Diagnosis not present

## 2015-12-07 ENCOUNTER — Encounter: Payer: Self-pay | Admitting: Internal Medicine

## 2016-02-03 DIAGNOSIS — D1801 Hemangioma of skin and subcutaneous tissue: Secondary | ICD-10-CM | POA: Diagnosis not present

## 2016-02-03 DIAGNOSIS — L821 Other seborrheic keratosis: Secondary | ICD-10-CM | POA: Diagnosis not present

## 2016-02-03 DIAGNOSIS — L570 Actinic keratosis: Secondary | ICD-10-CM | POA: Diagnosis not present

## 2016-02-03 DIAGNOSIS — Z85828 Personal history of other malignant neoplasm of skin: Secondary | ICD-10-CM | POA: Diagnosis not present

## 2016-02-07 DIAGNOSIS — Z961 Presence of intraocular lens: Secondary | ICD-10-CM | POA: Diagnosis not present

## 2016-02-07 DIAGNOSIS — H259 Unspecified age-related cataract: Secondary | ICD-10-CM | POA: Diagnosis not present

## 2016-02-07 DIAGNOSIS — H34811 Central retinal vein occlusion, right eye, with macular edema: Secondary | ICD-10-CM | POA: Diagnosis not present

## 2016-02-07 DIAGNOSIS — H4089 Other specified glaucoma: Secondary | ICD-10-CM | POA: Diagnosis not present

## 2016-02-07 DIAGNOSIS — H01029 Squamous blepharitis unspecified eye, unspecified eyelid: Secondary | ICD-10-CM | POA: Diagnosis not present

## 2016-02-07 DIAGNOSIS — H401110 Primary open-angle glaucoma, right eye, stage unspecified: Secondary | ICD-10-CM | POA: Diagnosis not present

## 2016-02-07 DIAGNOSIS — H401123 Primary open-angle glaucoma, left eye, severe stage: Secondary | ICD-10-CM | POA: Diagnosis not present

## 2016-02-07 DIAGNOSIS — H401112 Primary open-angle glaucoma, right eye, moderate stage: Secondary | ICD-10-CM | POA: Diagnosis not present

## 2016-05-02 DIAGNOSIS — H04123 Dry eye syndrome of bilateral lacrimal glands: Secondary | ICD-10-CM | POA: Diagnosis not present

## 2016-05-02 DIAGNOSIS — H2512 Age-related nuclear cataract, left eye: Secondary | ICD-10-CM | POA: Diagnosis not present

## 2016-05-02 DIAGNOSIS — H401131 Primary open-angle glaucoma, bilateral, mild stage: Secondary | ICD-10-CM | POA: Diagnosis not present

## 2016-05-16 DIAGNOSIS — H401110 Primary open-angle glaucoma, right eye, stage unspecified: Secondary | ICD-10-CM | POA: Diagnosis not present

## 2016-05-16 DIAGNOSIS — Z961 Presence of intraocular lens: Secondary | ICD-10-CM | POA: Diagnosis not present

## 2016-05-16 DIAGNOSIS — H259 Unspecified age-related cataract: Secondary | ICD-10-CM | POA: Diagnosis not present

## 2016-05-16 DIAGNOSIS — H01029 Squamous blepharitis unspecified eye, unspecified eyelid: Secondary | ICD-10-CM | POA: Diagnosis not present

## 2016-05-16 DIAGNOSIS — H401123 Primary open-angle glaucoma, left eye, severe stage: Secondary | ICD-10-CM | POA: Diagnosis not present

## 2016-05-16 DIAGNOSIS — H401112 Primary open-angle glaucoma, right eye, moderate stage: Secondary | ICD-10-CM | POA: Diagnosis not present

## 2016-05-16 DIAGNOSIS — H4089 Other specified glaucoma: Secondary | ICD-10-CM | POA: Diagnosis not present

## 2016-05-16 DIAGNOSIS — H34811 Central retinal vein occlusion, right eye, with macular edema: Secondary | ICD-10-CM | POA: Diagnosis not present

## 2016-08-28 DIAGNOSIS — H401123 Primary open-angle glaucoma, left eye, severe stage: Secondary | ICD-10-CM | POA: Diagnosis not present

## 2016-08-28 DIAGNOSIS — H4089 Other specified glaucoma: Secondary | ICD-10-CM | POA: Diagnosis not present

## 2016-08-28 DIAGNOSIS — H259 Unspecified age-related cataract: Secondary | ICD-10-CM | POA: Diagnosis not present

## 2016-08-28 DIAGNOSIS — H401112 Primary open-angle glaucoma, right eye, moderate stage: Secondary | ICD-10-CM | POA: Diagnosis not present

## 2016-08-28 DIAGNOSIS — H01029 Squamous blepharitis unspecified eye, unspecified eyelid: Secondary | ICD-10-CM | POA: Diagnosis not present

## 2016-08-28 DIAGNOSIS — Z961 Presence of intraocular lens: Secondary | ICD-10-CM | POA: Diagnosis not present

## 2016-08-28 DIAGNOSIS — H401110 Primary open-angle glaucoma, right eye, stage unspecified: Secondary | ICD-10-CM | POA: Diagnosis not present

## 2016-09-04 ENCOUNTER — Encounter: Payer: Self-pay | Admitting: Internal Medicine

## 2016-09-04 ENCOUNTER — Ambulatory Visit (INDEPENDENT_AMBULATORY_CARE_PROVIDER_SITE_OTHER): Payer: BC Managed Care – PPO | Admitting: Internal Medicine

## 2016-09-04 VITALS — BP 98/62 | HR 52 | Ht 59.0 in | Wt 103.2 lb

## 2016-09-04 DIAGNOSIS — H5440 Blindness, one eye, unspecified eye: Secondary | ICD-10-CM | POA: Diagnosis not present

## 2016-09-04 DIAGNOSIS — I10 Essential (primary) hypertension: Secondary | ICD-10-CM | POA: Diagnosis not present

## 2016-09-04 DIAGNOSIS — Z23 Encounter for immunization: Secondary | ICD-10-CM

## 2016-09-04 DIAGNOSIS — K219 Gastro-esophageal reflux disease without esophagitis: Secondary | ICD-10-CM

## 2016-09-04 DIAGNOSIS — R8271 Bacteriuria: Secondary | ICD-10-CM | POA: Diagnosis not present

## 2016-09-04 DIAGNOSIS — C189 Malignant neoplasm of colon, unspecified: Secondary | ICD-10-CM | POA: Diagnosis not present

## 2016-09-04 DIAGNOSIS — E7800 Pure hypercholesterolemia, unspecified: Secondary | ICD-10-CM | POA: Diagnosis not present

## 2016-09-04 DIAGNOSIS — R001 Bradycardia, unspecified: Secondary | ICD-10-CM | POA: Diagnosis not present

## 2016-09-04 DIAGNOSIS — R7989 Other specified abnormal findings of blood chemistry: Secondary | ICD-10-CM | POA: Diagnosis not present

## 2016-09-04 DIAGNOSIS — Z0000 Encounter for general adult medical examination without abnormal findings: Secondary | ICD-10-CM

## 2016-09-04 DIAGNOSIS — H5710 Ocular pain, unspecified eye: Secondary | ICD-10-CM | POA: Insufficient documentation

## 2016-09-04 HISTORY — DX: Ocular pain, unspecified eye: H54.40

## 2016-09-04 LAB — POCT URINALYSIS DIPSTICK
BILIRUBIN UA: NEGATIVE
GLUCOSE UA: NEGATIVE
Ketones, UA: NEGATIVE
Nitrite, UA: POSITIVE
Protein, UA: NEGATIVE
SPEC GRAV UA: 1.01 (ref 1.030–1.035)
Urobilinogen, UA: 0.2 (ref ?–2.0)
pH, UA: 6 (ref 5.0–8.0)

## 2016-09-04 MED ORDER — CIPROFLOXACIN HCL 250 MG PO TABS: 250.0000 mg | ORAL_TABLET | Freq: Two times a day (BID) | ORAL | 0 refills | Status: DC

## 2016-09-04 MED ORDER — LOSARTAN POTASSIUM 50 MG PO TABS: 25.0000 mg | ORAL_TABLET | Freq: Every day | ORAL | 12 refills | Status: DC

## 2016-09-04 NOTE — Patient Instructions (Addendum)
Health Maintenance  Topic Date Due  . TETANUS/TDAP  09/05/2026  . DEXA SCAN  05/25/2000  . INFLUENZA VACCINE  01/11/2016  . PNA vac Low Risk Adult (1 of 2 - PCV13) 09/02/2020 (Originally 05/25/2000)

## 2016-09-04 NOTE — Progress Notes (Signed)
Patient: Tammy Mcgee, Female    DOB: 28-May-1933, 83 y.o.   MRN: [id number] Visit Date: 09/04/2016  Today's Provider: Halina Maidens, MD   Chief Complaint  Patient presents with  . Annual Exam    no pap   Subjective:    Annual wellness visit Tammy Mcgee is a 83 y.o. female who presents today for her Subsequent Annual Wellness Visit. She feels fairly well. She reports exercising regularly - yard and house work. She reports she is sleeping well.   ----------------------------------------------------------- Hypertension  This is a chronic problem. The current episode started more than 1 year ago. The problem is controlled. Associated symptoms include headaches (on left side of head when she reclines). Pertinent negatives include no chest pain, malaise/fatigue, orthopnea, palpitations, peripheral edema or shortness of breath.  Gastroesophageal Reflux  She complains of heartburn. She reports no abdominal pain, no chest pain, no coughing or no wheezing. This is a recurrent problem. The problem occurs occasionally. Pertinent negatives include no fatigue.  Weight loss - discussed diet with patient - she eats mostly fruit and sweets (cookies, ice cream) and very little protein or meat.  She sometimes eats nuts or beans.  Review of Systems  Constitutional: Positive for unexpected weight change (8 lbs since last year). Negative for chills, fatigue, fever and malaise/fatigue.  HENT: Positive for hearing loss. Negative for congestion, tinnitus, trouble swallowing and voice change.   Eyes: Positive for visual disturbance (blind in left eye).  Respiratory: Negative for cough, chest tightness, shortness of breath and wheezing.   Cardiovascular: Negative for chest pain, palpitations, orthopnea and leg swelling.  Gastrointestinal: Positive for constipation and heartburn. Negative for abdominal pain, diarrhea and vomiting.  Endocrine: Negative for polydipsia and polyuria.  Genitourinary:  Negative for dysuria, frequency, genital sores, vaginal bleeding and vaginal discharge.  Musculoskeletal: Negative for arthralgias, gait problem and joint swelling.  Skin: Negative for color change and rash.  Neurological: Positive for headaches (on left side of head when she reclines). Negative for dizziness, tremors and light-headedness.  Hematological: Negative for adenopathy. Does not bruise/bleed easily.  Psychiatric/Behavioral: Negative for dysphoric mood and sleep disturbance. The patient is not nervous/anxious.     Social History   Social History  . Marital status: Divorced    Spouse name: N/A  . Number of children: N/A  . Years of education: N/A   Occupational History  . Not on file.   Social History Main Topics  . Smoking status: Never Smoker  . Smokeless tobacco: Never Used  . Alcohol use No  . Drug use: No  . Sexual activity: Not on file   Other Topics Concern  . Not on file   Social History Narrative  . No narrative on file    Patient Active Problem List   Diagnosis Date Noted  . Blindness of one eye with low vision in contralateral eye 04/08/2015  . Bradycardia 04/08/2015  . Central retinal vein occlusion 04/08/2015  . Essential (primary) hypertension 04/08/2015  . Gastro-esophageal reflux disease without esophagitis 04/08/2015  . Glaucoma 04/08/2015  . History of hearing problem 04/08/2015  . Hypercholesterolemia 04/08/2015  . Malignant neoplasm of colon ([place]) 04/08/2015    Past Surgical History:  Procedure Laterality Date  . APPENDECTOMY    . CATARACT EXTRACTION W/ INTRAOCULAR LENS IMPLANT Right 06/17/2015  . CERVIX LESION DESTRUCTION    . ESOPHAGOGASTRODUODENOSCOPY  2004   HH  . PARTIAL COLECTOMY  2004   colon cancer    Her family history includes  CAD in her father; Heart attack in her father; Hypertension in her mother.     Previous Medications   BRIMONIDINE-TIMOLOL (COMBIGAN) 0.2-0.5 % OPHTHALMIC SOLUTION    Apply to eye.   ESOMEPRAZOLE  (NEXIUM 24HR) 20 MG CAPSULE    Take 1 capsule by mouth daily.   LOSARTAN (COZAAR) 50 MG TABLET    Take 0.5 tablets (25 mg total) by mouth daily.    Patient Care Team: Glean Hess, MD as PCP - General (Family Medicine)      Objective:   Vitals: BP 98/62 (BP Location: Right Arm, Patient Position: Sitting, Cuff Size: Normal)   Pulse (!) 52   Ht 4\' 11"  (1.499 m)   Wt 103 lb 3.2 oz (46.8 kg)   SpO2 100%   BMI 20.84 kg/m   Physical Exam  Constitutional: She is oriented to person, place, and time. She appears well-developed and well-nourished. No distress.  HENT:  Head: Normocephalic and atraumatic.  Right Ear: Tympanic membrane and ear canal normal.  Left Ear: Tympanic membrane and ear canal normal.  Nose: Right sinus exhibits no maxillary sinus tenderness. Left sinus exhibits no maxillary sinus tenderness.  Mouth/Throat: Uvula is midline and oropharynx is clear and moist.  Eyes: Conjunctivae and EOM are normal. Right eye exhibits no discharge. No scleral icterus.  Neck: Normal range of motion. Carotid bruit is not present. No erythema present. No thyromegaly present.  Cardiovascular: Normal rate, regular rhythm, normal heart sounds and normal pulses.   Pulmonary/Chest: Effort normal. No respiratory distress. She has no wheezes. Right breast exhibits no mass, no nipple discharge, no skin change and no tenderness. Left breast exhibits no mass, no nipple discharge, no skin change and no tenderness.  Abdominal: Soft. Bowel sounds are normal. There is no hepatosplenomegaly. There is no tenderness. There is no CVA tenderness.  Musculoskeletal: Normal range of motion. She exhibits no edema or tenderness.       Cervical back: She exhibits normal range of motion and no tenderness.  Lymphadenopathy:    She has no cervical adenopathy.    She has no axillary adenopathy.  Neurological: She is alert and oriented to person, place, and time. She has normal reflexes. No cranial nerve deficit or  sensory deficit.  No scalp tenderness on left No TMJ click Normal strength and reflexes in both UE  Skin: Skin is warm, dry and intact. No rash noted.  Psychiatric: She has a normal mood and affect. Her speech is normal and behavior is normal. Judgment and thought content normal. Cognition and memory are normal.  Nursing note and vitals reviewed.   Activities of Daily Living In your present state of health, do you have any difficulty performing the following activities: 09/04/2016  Hearing? Y  Vision? Y  Difficulty concentrating or making decisions? N  Walking or climbing stairs? N  Dressing or bathing? N  Doing errands, shopping? N  Preparing Food and eating ? N  Using the Toilet? N  In the past six months, have you accidently leaked urine? N  Do you have problems with loss of bowel control? N  Managing your Medications? N  Managing your Finances? N  Housekeeping or managing your Housekeeping? N  Some recent data might be hidden    Fall Risk Assessment Fall Risk  09/04/2016 09/03/2015  Falls in the past year? Yes Yes  Number falls in past yr: 2 or more 2 or more  Injury with Fall? No No  Risk Factor Category  High Fall Risk -  Follow up - Falls evaluation completed      Depression Screen PHQ 2/9 Scores 09/04/2016 09/03/2015  PHQ - 2 Score 0 0   6CIT Screen 09/04/2016  What Year? 0 points  What month? 0 points  What time? 0 points  Count back from 20 0 points  Months in reverse 0 points  Repeat phrase 0 points  Total Score 0    Medicare Annual Wellness Visit Summary:  Reviewed patient's Family Medical History Reviewed and updated list of patient's medical providers Assessment of cognitive impairment was done Assessed patient's functional ability Established a written schedule for health screening [place] Completed and Reviewed  Exercise Activities and Dietary recommendations Goals    None       There is no immunization history on  file for this patient.  Health Maintenance  Topic Date Due  . TETANUS/TDAP  05/25/1954  . DEXA SCAN  05/25/2000  . INFLUENZA VACCINE  01/11/2016  . PNA vac Low Risk Adult (1 of 2 - PCV13) 09/02/2020 (Originally 05/25/2000)    Discussed health benefits of physical activity, and encouraged her to engage in regular exercise appropriate for her age and condition.    ------------------------------------------------------------------------------------------------------------  Assessment & Plan:   1. Medicare annual wellness visit, subsequent Measures satisfied Continues to decline Pneumonia vaccines, Flu vaccines, mammograms and colonscopy - POCT urinalysis dipstick  2. Essential (primary) hypertension controlled - losartan (COZAAR) 50 MG tablet; Take 0.5 tablets (25 mg total) by mouth daily.  Dispense: 30 tablet; Refill: 12 - Comprehensive metabolic panel - TSH  3. Gastro-esophageal reflux disease without esophagitis Use Nexium PRN - CBC with Differential/Platelet  4. Malignant neoplasm of colon, unspecified part of colon ([place]) No sx referable to colon Weight loss is concerning - discussed increasing protein intake  5. Bradycardia stable  6. Hypercholesterolemia - Lipid panel  7. Need for diphtheria-tetanus-pertussis (Tdap) vaccine - Tdap vaccine greater than or equal to 7yo IM  8. Bacteriuria Cipro x 3 days   Meds ordered this encounter  Medications  . losartan (COZAAR) 50 MG tablet    Sig: Take 0.5 tablets (25 mg total) by mouth daily.    Dispense:  30 tablet    Refill:  12  . ciprofloxacin (CIPRO) 250 MG tablet    Sig: Take 1 tablet (250 mg total) by mouth 2 (two) times daily.    Dispense:  6 tablet    Refill:  0    Halina Maidens, MD [facility]  09/04/2016

## 2016-09-05 LAB — CBC WITH DIFFERENTIAL/PLATELET
Basophils Absolute: 0 10*3/uL (ref 0.0–0.2)
Basos: 0 %
EOS (ABSOLUTE): 0.1 10*3/uL (ref 0.0–0.4)
Eos: 2 %
HEMATOCRIT: 39 % (ref 34.0–46.6)
HEMOGLOBIN: 13 g/dL (ref 11.1–15.9)
IMMATURE GRANULOCYTES: 0 %
Immature Grans (Abs): 0 10*3/uL (ref 0.0–0.1)
LYMPHS ABS: 1.3 10*3/uL (ref 0.7–3.1)
LYMPHS: 21 %
MCH: 30 pg (ref 26.6–33.0)
MCHC: 33.3 g/dL (ref 31.5–35.7)
MCV: 90 fL (ref 79–97)
Monocytes Absolute: 0.4 10*3/uL (ref 0.1–0.9)
Monocytes: 6 %
Neutrophils Absolute: 4.5 10*3/uL (ref 1.4–7.0)
Neutrophils: 71 %
Platelets: 224 10*3/uL (ref 150–379)
RBC: 4.34 x10E6/uL (ref 3.77–5.28)
RDW: 13.8 % (ref 12.3–15.4)
WBC: 6.3 10*3/uL (ref 3.4–10.8)

## 2016-09-05 LAB — COMPREHENSIVE METABOLIC PANEL
A/G RATIO: 1.6 (ref 1.2–2.2)
ALT: 8 IU/L (ref 0–32)
AST: 18 IU/L (ref 0–40)
Albumin: 4.2 g/dL (ref 3.5–4.7)
Alkaline Phosphatase: 121 IU/L — ABNORMAL HIGH (ref 39–117)
BILIRUBIN TOTAL: 0.5 mg/dL (ref 0.0–1.2)
BUN / CREAT RATIO: 16 (ref 12–28)
BUN: 12 mg/dL (ref 8–27)
CHLORIDE: 100 mmol/L (ref 96–106)
CO2: 25 mmol/L (ref 18–29)
Calcium: 9.3 mg/dL (ref 8.7–10.3)
Creatinine, Ser: 0.73 mg/dL (ref 0.57–1.00)
GFR calc Af Amer: 89 mL/min/{1.73_m2} (ref >59)
GFR calc non Af Amer: 77 mL/min/{1.73_m2} (ref >59)
GLOBULIN, TOTAL: 2.6 g/dL (ref 1.5–4.5)
Glucose: 99 mg/dL (ref 65–99)
POTASSIUM: 4.4 mmol/L (ref 3.5–5.2)
SODIUM: 143 mmol/L (ref 134–144)
Total Protein: 6.8 g/dL (ref 6.0–8.5)

## 2016-09-05 LAB — LIPID PANEL
Chol/HDL Ratio: 2.5 ratio units (ref 0.0–4.4)
Cholesterol, Total: 220 mg/dL — ABNORMAL HIGH (ref 100–199)
HDL: 89 mg/dL (ref >39)
LDL Calculated: 115 mg/dL — ABNORMAL HIGH (ref 0–99)
Triglycerides: 81 mg/dL (ref 0–149)
VLDL Cholesterol Cal: 16 mg/dL (ref 5–40)

## 2016-09-05 LAB — TSH: TSH: 5.1 u[IU]/mL — AB (ref 0.450–4.500)

## 2016-09-05 NOTE — Progress Notes (Signed)
T4 and T3 added. Awaiting result.

## 2016-09-10 LAB — SPECIMEN STATUS REPORT

## 2016-09-10 LAB — T3: T3, Total: 171 ng/dL (ref 71–180)

## 2016-09-10 LAB — T4: T4, Total: 7.2 ug/dL (ref 4.5–12.0)

## 2016-10-31 DIAGNOSIS — H401131 Primary open-angle glaucoma, bilateral, mild stage: Secondary | ICD-10-CM | POA: Diagnosis not present

## 2016-10-31 DIAGNOSIS — H04123 Dry eye syndrome of bilateral lacrimal glands: Secondary | ICD-10-CM | POA: Diagnosis not present

## 2016-10-31 DIAGNOSIS — H2512 Age-related nuclear cataract, left eye: Secondary | ICD-10-CM | POA: Diagnosis not present

## 2016-12-28 DIAGNOSIS — H01029 Squamous blepharitis unspecified eye, unspecified eyelid: Secondary | ICD-10-CM | POA: Diagnosis not present

## 2016-12-28 DIAGNOSIS — H259 Unspecified age-related cataract: Secondary | ICD-10-CM | POA: Diagnosis not present

## 2016-12-28 DIAGNOSIS — Z961 Presence of intraocular lens: Secondary | ICD-10-CM | POA: Diagnosis not present

## 2016-12-28 DIAGNOSIS — H401112 Primary open-angle glaucoma, right eye, moderate stage: Secondary | ICD-10-CM | POA: Diagnosis not present

## 2016-12-28 DIAGNOSIS — H34811 Central retinal vein occlusion, right eye, with macular edema: Secondary | ICD-10-CM | POA: Diagnosis not present

## 2016-12-28 DIAGNOSIS — H401123 Primary open-angle glaucoma, left eye, severe stage: Secondary | ICD-10-CM | POA: Diagnosis not present

## 2016-12-28 DIAGNOSIS — H4089 Other specified glaucoma: Secondary | ICD-10-CM | POA: Diagnosis not present

## 2017-02-01 DIAGNOSIS — L821 Other seborrheic keratosis: Secondary | ICD-10-CM | POA: Diagnosis not present

## 2017-02-01 DIAGNOSIS — L720 Epidermal cyst: Secondary | ICD-10-CM | POA: Diagnosis not present

## 2017-02-01 DIAGNOSIS — L820 Inflamed seborrheic keratosis: Secondary | ICD-10-CM | POA: Diagnosis not present

## 2017-02-01 DIAGNOSIS — L814 Other melanin hyperpigmentation: Secondary | ICD-10-CM | POA: Diagnosis not present

## 2017-02-01 DIAGNOSIS — Z85828 Personal history of other malignant neoplasm of skin: Secondary | ICD-10-CM | POA: Diagnosis not present

## 2017-02-01 DIAGNOSIS — L570 Actinic keratosis: Secondary | ICD-10-CM | POA: Diagnosis not present

## 2017-02-19 DIAGNOSIS — H04123 Dry eye syndrome of bilateral lacrimal glands: Secondary | ICD-10-CM | POA: Diagnosis not present

## 2017-02-19 DIAGNOSIS — H2512 Age-related nuclear cataract, left eye: Secondary | ICD-10-CM | POA: Diagnosis not present

## 2017-02-19 DIAGNOSIS — H401131 Primary open-angle glaucoma, bilateral, mild stage: Secondary | ICD-10-CM | POA: Diagnosis not present

## 2017-02-19 DIAGNOSIS — D3131 Benign neoplasm of right choroid: Secondary | ICD-10-CM | POA: Diagnosis not present

## 2017-05-01 DIAGNOSIS — H259 Unspecified age-related cataract: Secondary | ICD-10-CM | POA: Diagnosis not present

## 2017-05-01 DIAGNOSIS — H34811 Central retinal vein occlusion, right eye, with macular edema: Secondary | ICD-10-CM | POA: Diagnosis not present

## 2017-05-01 DIAGNOSIS — H401123 Primary open-angle glaucoma, left eye, severe stage: Secondary | ICD-10-CM | POA: Diagnosis not present

## 2017-05-01 DIAGNOSIS — H4089 Other specified glaucoma: Secondary | ICD-10-CM | POA: Diagnosis not present

## 2017-05-01 DIAGNOSIS — H04121 Dry eye syndrome of right lacrimal gland: Secondary | ICD-10-CM | POA: Diagnosis not present

## 2017-05-01 DIAGNOSIS — Z961 Presence of intraocular lens: Secondary | ICD-10-CM | POA: Diagnosis not present

## 2017-05-01 DIAGNOSIS — H01029 Squamous blepharitis unspecified eye, unspecified eyelid: Secondary | ICD-10-CM | POA: Diagnosis not present

## 2017-08-01 ENCOUNTER — Telehealth: Payer: Self-pay

## 2017-08-01 NOTE — Telephone Encounter (Signed)
Called pt to sched AWV w/ [facility]. Unable to reach d/t no VM being set up.

## 2017-08-01 NOTE — Telephone Encounter (Signed)
Called pt to sched AWV w/ [facility]. Last AWV 08/25/16. LVM requesting returned call.

## 2017-08-02 DIAGNOSIS — L570 Actinic keratosis: Secondary | ICD-10-CM | POA: Diagnosis not present

## 2017-08-02 DIAGNOSIS — L821 Other seborrheic keratosis: Secondary | ICD-10-CM | POA: Diagnosis not present

## 2017-08-02 DIAGNOSIS — Z85828 Personal history of other malignant neoplasm of skin: Secondary | ICD-10-CM | POA: Diagnosis not present

## 2017-08-02 DIAGNOSIS — T691XXA Chilblains, initial encounter: Secondary | ICD-10-CM | POA: Diagnosis not present

## 2017-08-03 ENCOUNTER — Telehealth: Payer: Self-pay

## 2017-08-03 NOTE — Telephone Encounter (Signed)
Called pt to sched AWV w/ [facility]. Appt has been scheduled for 09/05/17 @ 8:15am

## 2017-08-23 ENCOUNTER — Telehealth: Payer: Self-pay | Admitting: Internal Medicine

## 2017-08-23 NOTE — Telephone Encounter (Signed)
Re-sched awv °

## 2017-08-29 DIAGNOSIS — Z961 Presence of intraocular lens: Secondary | ICD-10-CM | POA: Diagnosis not present

## 2017-08-29 DIAGNOSIS — H34811 Central retinal vein occlusion, right eye, with macular edema: Secondary | ICD-10-CM | POA: Diagnosis not present

## 2017-08-29 DIAGNOSIS — H01029 Squamous blepharitis unspecified eye, unspecified eyelid: Secondary | ICD-10-CM | POA: Diagnosis not present

## 2017-08-29 DIAGNOSIS — H259 Unspecified age-related cataract: Secondary | ICD-10-CM | POA: Diagnosis not present

## 2017-08-29 DIAGNOSIS — H04121 Dry eye syndrome of right lacrimal gland: Secondary | ICD-10-CM | POA: Diagnosis not present

## 2017-08-29 DIAGNOSIS — H401112 Primary open-angle glaucoma, right eye, moderate stage: Secondary | ICD-10-CM | POA: Diagnosis not present

## 2017-08-29 DIAGNOSIS — H4089 Other specified glaucoma: Secondary | ICD-10-CM | POA: Diagnosis not present

## 2017-08-29 DIAGNOSIS — H401123 Primary open-angle glaucoma, left eye, severe stage: Secondary | ICD-10-CM | POA: Diagnosis not present

## 2017-08-30 ENCOUNTER — Ambulatory Visit (INDEPENDENT_AMBULATORY_CARE_PROVIDER_SITE_OTHER): Payer: Medicare Other

## 2017-08-30 VITALS — BP 104/60 | HR 70 | Temp 98.1°F | Resp 12 | Ht 59.0 in | Wt 103.2 lb

## 2017-08-30 DIAGNOSIS — Z0000 Encounter for general adult medical examination without abnormal findings: Secondary | ICD-10-CM | POA: Diagnosis not present

## 2017-08-30 NOTE — Progress Notes (Signed)
Subjective:   Tammy Mcgee is a 84 y.o. female who presents for Medicare Annual (Subsequent) preventive examination.  Review of Systems:  N/A Cardiac Risk Factors include: advanced age (>37men, >73 women);hypertension;dyslipidemia;family history of premature cardiovascular disease;sedentary lifestyle     Objective:     Vitals: BP 104/60 (BP Location: Right Arm, Patient Position: Sitting, Cuff Size: Normal)   Pulse 70   Temp 98.1 F (36.7 C) (Oral)   Resp 12   Ht 4\' 11"  (1.499 m)   Wt 103 lb 3.2 oz (46.8 kg)   SpO2 97%   BMI 20.84 kg/m   Body mass index is 20.84 kg/m.  Advanced Directives 08/30/2017 09/04/2016 09/03/2015  Does Patient Have a Medical Advance Directive? No No No  Would patient like information on creating a medical advance directive? Yes (MAU/Ambulatory/Procedural Areas - Information given) - No - patient declined information    Tobacco Social History   Tobacco Use  Smoking Status Never Smoker  Smokeless Tobacco Never Used  Tobacco Comment   smoking cessation materials not required     Counseling given: No Comment: smoking cessation materials not required   Clinical Intake:  Pre-visit preparation completed: Yes  Pain : No/denies pain   BMI - recorded: 20.84 Nutritional Status: BMI of 19-24  Normal Nutritional Risks: None Diabetes: No  How often do you need to have someone help you when you read instructions, pamphlets, or other written materials from your doctor or pharmacy?: 1 - Never  Interpreter Needed?: No  Information entered by :: AEversole, LPN  Past Medical History:  Diagnosis Date  . GERD (gastroesophageal reflux disease)   . Hyperlipidemia   . Hypertension   . Legally blind in left eye, as defined in [place]    Past Surgical History:  Procedure Laterality Date  . APPENDECTOMY    . CATARACT EXTRACTION W/ INTRAOCULAR LENS IMPLANT Right 06/17/2015  . CERVIX LESION DESTRUCTION    . ESOPHAGOGASTRODUODENOSCOPY  2004   HH  .  PARTIAL COLECTOMY  2004   colon cancer   Family History  Problem Relation Age of Onset  . Hypertension Mother   . CAD Father   . Heart attack Father   . Stroke Sister    Social History   Socioeconomic History  . Marital status: Divorced    Spouse name: Not on file  . Number of children: 4  . Years of education: GED  . Highest education level: 8th grade  Occupational History  . Occupation: Retired  Scientific laboratory technician  . Financial resource strain: Not hard at all  . Food insecurity:    Worry: Never true    Inability: Not on file  . Transportation needs:    Medical: No    Non-medical: No  Tobacco Use  . Smoking status: Never Smoker  . Smokeless tobacco: Never Used  . Tobacco comment: smoking cessation materials not required  Substance and Sexual Activity  . Alcohol use: No    Alcohol/week: 0.0 oz  . Drug use: No  . Sexual activity: Not Currently  Lifestyle  . Physical activity:    Days per week: 7 days    Minutes per session: 10 min  . Stress: Not at all  Relationships  . Social connections:    Talks on phone: Patient refused    Gets together: Patient refused    Attends religious service: Patient refused    Active member of club or organization: Patient refused    Attends meetings of clubs or organizations: Patient  refused    Relationship status: Divorced  Other Topics Concern  . Not on file  Social History Narrative  . Not on file    Outpatient Encounter Medications as of 08/30/2017  Medication Sig  . brimonidine-timolol (COMBIGAN) 0.2-0.5 % ophthalmic solution Apply to eye.  . losartan (COZAAR) 50 MG tablet Take 0.5 tablets (25 mg total) by mouth daily.  . [DISCONTINUED] ciprofloxacin (CIPRO) 250 MG tablet Take 1 tablet (250 mg total) by mouth 2 (two) times daily.  . [DISCONTINUED] esomeprazole (NEXIUM 24HR) 20 MG capsule Take 1 capsule by mouth daily.   No facility-administered encounter medications on file as of 08/30/2017.     Activities of Daily Living In  your present state of health, do you have any difficulty performing the following activities: 08/30/2017 09/04/2016  Hearing? N Y  Comment wears hearing aids -  Vision? Y Y  Comment wears eyeglasses; blind in L eye Blind in left eye.  Difficulty concentrating or making decisions? Y N  Walking or climbing stairs? N N  Dressing or bathing? N N  Doing errands, shopping? N N  Preparing Food and eating ? N N  Comment full upper dentures and partial lower dentures -  Using the Toilet? N N  In the past six months, have you accidently leaked urine? N N  Do you have problems with loss of bowel control? N N  Managing your Medications? N N  Managing your Finances? N N  Housekeeping or managing your Housekeeping? N N  Some recent data might be hidden    Patient Care Team: Glean Hess, MD as PCP - General (Internal Medicine)    Assessment:   This is a routine wellness examination for May.  Exercise Activities and Dietary recommendations Current Exercise Habits: Home exercise routine, Type of exercise: stretching, Time (Minutes): 20, Frequency (Times/Week): 7, Weekly Exercise (Minutes/Week): 140, Intensity: Mild, Exercise limited by: None identified  Goals    . DIET - INCREASE WATER INTAKE     Recommend to drink at least 6-8 8oz glasses of water per day.       Fall Risk Fall Risk  08/30/2017 09/04/2016 09/03/2015  Falls in the past year? Yes Yes Yes  Comment fell after cutting logs. States she tripped over log and fell on to ground - -  Number falls in past yr: 2 or more 2 or more 2 or more  Injury with Fall? Yes No No  Risk Factor Category  High Fall Risk High Fall Risk -  Risk for fall due to : Impaired balance/gait;History of fall(s);Impaired vision - -  Risk for fall due to: Comment staggering gait; blind left eye - -  Follow up Falls evaluation completed;Education provided;Falls prevention discussed - Falls evaluation completed   Is the patient's home free of loose throw  rugs in walkways, pet beds, electrical cords, etc?   Yes Does the patient have any grab bars in the bathroom? No Does the patient use a shower chair when bathing? No Does the patient have any stairs in or around the home? No If so, are there any handrails?  N/A Does the patient have adequate lighting?  Yes Does the patient use a cane, walker or w/c? No Does the patient use of an elevated toilet seat? No  Timed Get Up and Go Performed: Yes. Pt ambulated 10 feet within 10 sec. Gait stead-fast and without the use of an assistive device. No intervention required at this time. Fall risk prevention has been discussed.  Pt declined my offer to send Community Resource Referral to Care Guide for  installation of grab bars in the shower, shower chair or an elevated toilet seat.  Depression Screen PHQ 2/9 Scores 08/30/2017 09/04/2016 09/03/2015  PHQ - 2 Score 0 0 0  PHQ- 9 Score 0 - -     Cognitive Function     6CIT Screen 08/30/2017 09/04/2016  What Year? 0 points 0 points  What month? 0 points 0 points  What time? 0 points 0 points  Count back from 20 0 points 0 points  Months in reverse 0 points 0 points  Repeat phrase 8 points 0 points  Total Score 8 0    Immunization History  Administered Date(s) Administered  . Tdap 09/04/2016    Qualifies for Shingles Vaccine? Yes. Due for Zostavax or Shingrix vaccine. Education has been provided regarding the importance of this vaccine. Pt has been advised to call her insurance company to determine her out of pocket expense. Advised she may also receive this vaccine at her local pharmacy or Health Dept. Verbalized acceptance and understanding.  Due for Flu and Pneumoccocal vaccines. Declined my offer to administer today. Education has been provided regarding the importance of this vaccine but still declined. Pt has been advised to call our office is she should change her mind and wish to receive this vaccine. Also advised he/she may receive this vaccine  at her local pharmacy or Health Dept. Pt is aware to provide a copy of her vaccination record if she chooses to receive this vaccine at his/her local pharmacy. Verbalized acceptance and understanding.  Screening Tests Health Maintenance  Topic Date Due  . INFLUENZA VACCINE  05/02/2018 (Originally 01/10/2017)  . PNA vac Low Risk Adult (1 of 2 - PCV13) 09/02/2020 (Originally 05/25/2000)  . TETANUS/TDAP  09/05/2026  . DEXA SCAN  Discontinued    Cancer Screenings: Lung: Low Dose CT Chest recommended if Age 29-80 years, 30 pack-year currently smoking OR have quit w/in 15years. Patient does not qualify. Breast Screenings: No longer required Bone Density/Dexa: No longer required Colorectal: No longer required  Additional Screenings: Hepatitis B/HIV/Syphillis: Does not qualify Hepatitis C Screening: Does not qualify     Plan:  I have personally reviewed and addressed the Medicare Annual Wellness questionnaire and have noted the following in the patient's chart:  A. Medical and social history B. Use of alcohol, tobacco or illicit drugs  C. Current medications and supplements D. Functional ability and status E.  Nutritional status F.  Physical activity G. Advance directives H. List of other physicians I.  Hospitalizations, surgeries, and ER visits in previous 12 months J.  [place] such as hearing and vision if needed, cognitive and depression L. Referrals and appointments - none  In addition, I have reviewed and discussed with patient certain preventive protocols, quality metrics, and best practice recommendations. A written personalized care plan for preventive services as well as general preventive health recommendations were provided to patient.  Signed,  Aleatha Borer, LPN Nurse Health Advisor  MD Recommendations: Due for Zostavax or Shingrix vaccine. Education has been provided regarding the importance of this vaccine. Pt has been advised to call her insurance  company to determine her out of pocket expense. Advised she may also receive this vaccine at her local pharmacy or Health Dept. Verbalized acceptance and understanding.  Due for Flu and Pneumoccocal vaccines. Declined my offer to administer today. Education has been provided regarding the importance of this vaccine but still declined. Pt  has been advised to call our office is she should change her mind and wish to receive this vaccine. Also advised he/she may receive this vaccine at her local pharmacy or Health Dept. Pt is aware to provide a copy of her vaccination record if she chooses to receive this vaccine at his/her local pharmacy. Verbalized acceptance and understanding.

## 2017-08-30 NOTE — Patient Instructions (Signed)
Tammy Mcgee , Thank you for taking time to come for your Medicare Wellness Visit. I appreciate your ongoing commitment to your health goals. Please review the following plan we discussed and let me know if I can assist you in the future.   Screening recommendations/referrals: Colorectal Screening: No longer required Mammogram: No longer required Bone Density: No longer required  Vision/Dental Exams: Recommended yearly ophthalmology/optometry visit for glaucoma screening and checkup Recommended yearly dental visit for hygiene and checkup  Vaccinations: Influenza vaccine: Declined Pneumococcal vaccine: Declined Tdap vaccine: Up to date Shingles vaccine: Please call your insurance company to determine your out of pocket expense for the Shingrix vaccine. You may also receive this vaccine at your local pharmacy or Health Dept.  Advanced directives: Advance directive discussed with you today. I have provided a copy for you to complete at home and have notarized. Once this is complete please bring a copy in to our office so we can scan it into your chart.  Conditions/risks identified: Recommend to drink at least 6-8 8oz glasses of water per day.  Next appointment: Please schedule your Annual Wellness Visit with your Nurse Health Advisor in one year.  Preventive Care 40-64 Years, Female Preventive care refers to lifestyle choices and visits with your health care provider that can promote health and wellness. What does preventive care include?  A yearly physical exam. This is also called an annual well check.  Dental exams once or twice a year.  Routine eye exams. Ask your health care provider how often you should have your eyes checked.  Personal lifestyle choices, including:  Daily care of your teeth and gums.  Regular physical activity.  Eating a healthy diet.  Avoiding tobacco and drug use.  Limiting alcohol use.  Practicing safe sex.  Taking low-dose aspirin daily starting  at age 28.  Taking vitamin and mineral supplements as recommended by your health care provider. What happens during an annual well check? The services and screenings done by your health care provider during your annual well check will depend on your age, overall health, lifestyle risk factors, and family history of disease. Counseling  Your health care provider may ask you questions about your:  Alcohol use.  Tobacco use.  Drug use.  Emotional well-being.  Home and relationship well-being.  Sexual activity.  Eating habits.  Work and work Statistician.  Method of birth control.  Menstrual cycle.  Pregnancy history. Screening  You may have the following tests or measurements:  Height, weight, and BMI.  Blood pressure.  Lipid and cholesterol levels. These may be checked every 5 years, or more frequently if you are over 63 years old.  Skin check.  Lung cancer screening. You may have this screening every year starting at age 61 if you have a 30-pack-year history of smoking and currently smoke or have quit within the past 15 years.  Fecal occult blood test (FOBT) of the stool. You may have this test every year starting at age 49.  Flexible sigmoidoscopy or colonoscopy. You may have a sigmoidoscopy every 5 years or a colonoscopy every 10 years starting at age 38.  Hepatitis C blood test.  Hepatitis B blood test.  Sexually transmitted disease (STD) testing.  Diabetes screening. This is done by checking your blood sugar (glucose) after you have not eaten for a while (fasting). You may have this done every 1-3 years.  Mammogram. This may be done every 1-2 years. Talk to your health care provider about when you should start  having regular mammograms. This may depend on whether you have a family history of breast cancer.  BRCA-related cancer screening. This may be done if you have a family history of breast, ovarian, tubal, or peritoneal cancers.  Pelvic exam and Pap  test. This may be done every 3 years starting at age 77. Starting at age 37, this may be done every 5 years if you have a Pap test in combination with an HPV test.  Bone density scan. This is done to screen for osteoporosis. You may have this scan if you are at high risk for osteoporosis. Discuss your test results, treatment options, and if necessary, the need for more tests with your health care provider. Vaccines  Your health care provider may recommend certain vaccines, such as:  Influenza vaccine. This is recommended every year.  Tetanus, diphtheria, and acellular pertussis (Tdap, Td) vaccine. You may need a Td booster every 10 years.  Zoster vaccine. You may need this after age 70.  Pneumococcal 13-valent conjugate (PCV13) vaccine. You may need this if you have certain conditions and were not previously vaccinated.  Pneumococcal polysaccharide (PPSV23) vaccine. You may need one or two doses if you smoke cigarettes or if you have certain conditions. Talk to your health care provider about which screenings and vaccines you need and how often you need them. This information is not intended to replace advice given to you by your health care provider. Make sure you discuss any questions you have with your health care provider. Document Released: 06/25/2015 Document Revised: 02/16/2016 Document Reviewed: 03/30/2015 Elsevier Interactive Patient Education  2017 [place] Prevention in the Home Falls can cause injuries. They can happen to people of all ages. There are many things you can do to make your home safe and to help prevent falls. What can I do on the outside of my home?  Regularly fix the edges of walkways and driveways and fix any cracks.  Remove anything that might make you trip as you walk through a door, such as a raised step or threshold.  Trim any bushes or trees on the path to your home.  Use bright outdoor lighting.  Clear any walking paths of anything  that might make someone trip, such as rocks or tools.  Regularly check to see if handrails are loose or broken. Make sure that both sides of any steps have handrails.  Any raised decks and porches should have guardrails on the edges.  Have any leaves, snow, or ice cleared regularly.  Use sand or salt on walking paths during winter.  Clean up any spills in your garage right away. This includes oil or grease spills. What can I do in the bathroom?  Use night lights.  Install grab bars by the toilet and in the tub and shower. Do not use towel bars as grab bars.  Use non-skid mats or decals in the tub or shower.  If you need to sit down in the shower, use a plastic, non-slip stool.  Keep the floor dry. Clean up any water that spills on the floor as soon as it happens.  Remove soap buildup in the tub or shower regularly.  Attach bath mats securely with double-sided non-slip rug tape.  Do not have throw rugs and other things on the floor that can make you trip. What can I do in the bedroom?  Use night lights.  Make sure that you have a light by your bed that is easy to  reach.  Do not use any sheets or blankets that are too big for your bed. They should not hang down onto the floor.  Have a firm chair that has side arms. You can use this for support while you get dressed.  Do not have throw rugs and other things on the floor that can make you trip. What can I do in the kitchen?  Clean up any spills right away.  Avoid walking on wet floors.  Keep items that you use a lot in easy-to-reach places.  If you need to reach something above you, use a strong step stool that has a grab bar.  Keep electrical cords out of the way.  Do not use floor polish or wax that makes floors slippery. If you must use wax, use non-skid floor wax.  Do not have throw rugs and other things on the floor that can make you trip. What can I do with my stairs?  Do not leave any items on the  stairs.  Make sure that there are handrails on both sides of the stairs and use them. Fix handrails that are broken or loose. Make sure that handrails are as long as the stairways.  Check any carpeting to make sure that it is firmly attached to the stairs. Fix any carpet that is loose or worn.  Avoid having throw rugs at the top or bottom of the stairs. If you do have throw rugs, attach them to the floor with carpet tape.  Make sure that you have a light switch at the top of the stairs and the bottom of the stairs. If you do not have them, ask someone to add them for you. What else can I do to help prevent falls?  Wear shoes that:  Do not have high heels.  Have rubber bottoms.  Are comfortable and fit you well.  Are closed at the toe. Do not wear sandals.  If you use a stepladder:  Make sure that it is fully opened. Do not climb a closed stepladder.  Make sure that both sides of the stepladder are locked into place.  Ask someone to hold it for you, if possible.  Clearly mark and make sure that you can see:  Any grab bars or handrails.  First and last steps.  Where the edge of each step is.  Use tools that help you move around (mobility aids) if they are needed. These include:  Canes.  Walkers.  Scooters.  Crutches.  Turn on the lights when you go into a dark area. Replace any light bulbs as soon as they burn out.  Set up your furniture so you have a clear path. Avoid moving your furniture around.  If any of your floors are uneven, fix them.  If there are any pets around you, be aware of where they are.  Review your medicines with your doctor. Some medicines can make you feel dizzy. This can increase your chance of falling. Ask your doctor what other things that you can do to help prevent falls. This information is not intended to replace advice given to you by your health care provider. Make sure you discuss any questions you have with your health care  provider. Document Released: 03/25/2009 Document Revised: 11/04/2015 Document Reviewed: 07/03/2014 Elsevier Interactive Patient Education  2017 [employer].

## 2017-09-05 ENCOUNTER — Ambulatory Visit: Payer: Self-pay

## 2017-09-05 ENCOUNTER — Ambulatory Visit (INDEPENDENT_AMBULATORY_CARE_PROVIDER_SITE_OTHER): Payer: Medicare Other | Admitting: Internal Medicine

## 2017-09-05 ENCOUNTER — Encounter: Payer: Self-pay | Admitting: Internal Medicine

## 2017-09-05 VITALS — BP 134/78 | HR 64 | Ht 59.0 in | Wt 103.0 lb

## 2017-09-05 DIAGNOSIS — M19041 Primary osteoarthritis, right hand: Secondary | ICD-10-CM

## 2017-09-05 DIAGNOSIS — E7800 Pure hypercholesterolemia, unspecified: Secondary | ICD-10-CM

## 2017-09-05 DIAGNOSIS — K219 Gastro-esophageal reflux disease without esophagitis: Secondary | ICD-10-CM | POA: Diagnosis not present

## 2017-09-05 DIAGNOSIS — Z85038 Personal history of other malignant neoplasm of large intestine: Secondary | ICD-10-CM

## 2017-09-05 DIAGNOSIS — I10 Essential (primary) hypertension: Secondary | ICD-10-CM

## 2017-09-05 LAB — POCT URINALYSIS DIPSTICK
Bilirubin, UA: NEGATIVE
Glucose, UA: NEGATIVE
Ketones, UA: NEGATIVE
Leukocytes, UA: NEGATIVE
Nitrite, UA: POSITIVE
PROTEIN UA: NEGATIVE
SPEC GRAV UA: 1.015 (ref 1.010–1.025)
Urobilinogen, UA: 0.2 E.U./dL
pH, UA: 6 (ref 5.0–8.0)

## 2017-09-05 MED ORDER — LOSARTAN POTASSIUM 25 MG PO TABS: 25.0000 mg | ORAL_TABLET | Freq: Every day | ORAL | 12 refills | Status: DC

## 2017-09-05 NOTE — Patient Instructions (Signed)
Health Maintenance, Female Adopting a healthy lifestyle and getting preventive care can go a long way to promote health and wellness. Talk with your health care provider about what schedule of regular examinations is right for you. This is a good chance for you to check in with your provider about disease prevention and staying healthy. In between checkups, there are plenty of things you can do on your own. Experts have done a lot of research about which lifestyle changes and preventive measures are most likely to keep you healthy. Ask your health care provider for more information. Weight and diet Eat a healthy diet  Be sure to include plenty of vegetables, fruits, low-fat dairy products, and lean protein.  Do not eat a lot of foods high in solid fats, added sugars, or salt.  Get regular exercise. This is one of the most important things you can do for your health. ? Most adults should exercise for at least 150 minutes each week. The exercise should increase your heart rate and make you sweat (moderate-intensity exercise). ? Most adults should also do strengthening exercises at least twice a week. This is in addition to the moderate-intensity exercise.  Maintain a healthy weight  Body mass index (BMI) is a measurement that can be used to identify possible weight problems. It estimates body fat based on height and weight. Your health care provider can help determine your BMI and help you achieve or maintain a healthy weight.  For females 33 years of age and older: ? A BMI below 18.5 is considered underweight. ? A BMI of 18.5 to 24.9 is normal. ? A BMI of 25 to 29.9 is considered overweight. ? A BMI of 30 and above is considered obese.  Watch levels of cholesterol and blood lipids  You should start having your blood tested for lipids and cholesterol at 82 years of age, then have this test every 5 years.  You may need to have your cholesterol levels checked more often if: ? Your lipid or  cholesterol levels are high. ? You are older than 82 years of age. ? You are at high risk for heart disease.  Cancer screening Lung Cancer  Lung cancer screening is recommended for adults 82-70 years old who are at high risk for lung cancer because of a history of smoking.  A yearly low-dose CT scan of the lungs is recommended for people who: ? Currently smoke. ? Have quit within the past 15 years. ? Have at least a 30-pack-year history of smoking. A pack year is smoking an average of one pack of cigarettes a day for 1 year.  Yearly screening should continue until it has been 15 years since you quit.  Yearly screening should stop if you develop a health problem that would prevent you from having lung cancer treatment.  Breast Cancer  Practice breast self-awareness. This means understanding how your breasts normally appear and feel.  It also means doing regular breast self-exams. Let your health care provider know about any changes, no matter how small.  If you are in your 82s or 30s, you should have a clinical breast exam (CBE) by a health care provider every 1-3 years as part of a regular health exam.  If you are 82 or older, have a CBE every year. Also consider having a breast X-ray (mammogram) every year.  If you have a family history of breast cancer, talk to your health care provider about genetic screening.  If you are at high risk  for breast cancer, talk to your health care provider about having an MRI and a mammogram every year.  Breast cancer gene (BRCA) assessment is recommended for women who have family members with BRCA-related cancers. BRCA-related cancers include: ? Breast. ? Ovarian. ? Tubal. ? Peritoneal cancers.  Results of the assessment will determine the need for genetic counseling and BRCA1 and BRCA2 testing.  Cervical Cancer Your health care provider may recommend that you be screened regularly for cancer of the pelvic organs (ovaries, uterus, and  vagina). This screening involves a pelvic examination, including checking for microscopic changes to the surface of your cervix (Pap test). You may be encouraged to have this screening done every 3 years, beginning at age 82.  For women ages 82-65, health care providers may recommend pelvic exams and Pap testing every 3 years, or they may recommend the Pap and pelvic exam, combined with testing for human papilloma virus (HPV), every 5 years. Some types of HPV increase your risk of cervical cancer. Testing for HPV may also be done on women of any age with unclear Pap test results.  Other health care providers may not recommend any screening for nonpregnant women who are considered low risk for pelvic cancer and who do not have symptoms. Ask your health care provider if a screening pelvic exam is right for you.  If you have had past treatment for cervical cancer or a condition that could lead to cancer, you need Pap tests and screening for cancer for at least 20 years after your treatment. If Pap tests have been discontinued, your risk factors (such as having a new sexual partner) need to be reassessed to determine if screening should resume. Some women have medical problems that increase the chance of getting cervical cancer. In these cases, your health care provider may recommend more frequent screening and Pap tests.  Colorectal Cancer  This type of cancer can be detected and often prevented.  Routine colorectal cancer screening usually begins at 82 years of age and continues through 82 years of age.  Your health care provider may recommend screening at an earlier age if you have risk factors for colon cancer.  Your health care provider may also recommend using home test kits to check for hidden blood in the stool.  A small camera at the end of a tube can be used to examine your colon directly (sigmoidoscopy or colonoscopy). This is done to check for the earliest forms of colorectal  cancer.  Routine screening usually begins at age 82.  Direct examination of the colon should be repeated every 5-10 years through 82 years of age. However, you may need to be screened more often if early forms of precancerous polyps or small growths are found.  Skin Cancer  Check your skin from head to toe regularly.  Tell your health care provider about any new moles or changes in moles, especially if there is a change in a mole's shape or color.  Also tell your health care provider if you have a mole that is larger than the size of a pencil eraser.  Always use sunscreen. Apply sunscreen liberally and repeatedly throughout the day.  Protect yourself by wearing long sleeves, pants, a wide-brimmed hat, and sunglasses whenever you are outside.  Heart disease, diabetes, and high blood pressure  High blood pressure causes heart disease and increases the risk of stroke. High blood pressure is more likely to develop in: ? People who have blood pressure in the high end of  the normal range (130-139/85-89 mm Hg). ? People who are overweight or obese. ? People who are African American.  If you are 40-4 years of age, have your blood pressure checked every 3-5 years. If you are 66 years of age or older, have your blood pressure checked every year. You should have your blood pressure measured twice-once when you are at a hospital or clinic, and once when you are not at a hospital or clinic. Record the average of the two measurements. To check your blood pressure when you are not at a hospital or clinic, you can use: ? An automated blood pressure machine at a pharmacy. ? A home blood pressure monitor.  If you are between 53 years and 65 years old, ask your health care provider if you should take aspirin to prevent strokes.  Have regular diabetes screenings. This involves taking a blood sample to check your fasting blood sugar level. ? If you are at a normal weight and have a low risk for diabetes,  have this test once every three years after 82 years of age. ? If you are overweight and have a high risk for diabetes, consider being tested at a younger age or more often. Preventing infection Hepatitis B  If you have a higher risk for hepatitis B, you should be screened for this virus. You are considered at high risk for hepatitis B if: ? You were born in a country where hepatitis B is common. Ask your health care provider which countries are considered high risk. ? Your parents were born in a high-risk country, and you have not been immunized against hepatitis B (hepatitis B vaccine). ? You have HIV or AIDS. ? You use needles to inject street drugs. ? You live with someone who has hepatitis B. ? You have had sex with someone who has hepatitis B. ? You get hemodialysis treatment. ? You take certain medicines for conditions, including cancer, organ transplantation, and autoimmune conditions.  Hepatitis C  Blood testing is recommended for: ? Everyone born from [birth year] through 1963. ? Anyone with known risk factors for hepatitis C.  Sexually transmitted infections (STIs)  You should be screened for sexually transmitted infections (STIs) including gonorrhea and chlamydia if: ? You are sexually active and are younger than 82 years of age. ? You are older than 82 years of age and your health care provider tells you that you are at risk for this type of infection. ? Your sexual activity has changed since you were last screened and you are at an increased risk for chlamydia or gonorrhea. Ask your health care provider if you are at risk.  If you do not have HIV, but are at risk, it may be recommended that you take a prescription medicine daily to prevent HIV infection. This is called pre-exposure prophylaxis (PrEP). You are considered at risk if: ? You are sexually active and do not regularly use condoms or know the HIV status of your partner(s). ? You take drugs by injection. ? You are  sexually active with a partner who has HIV.  Talk with your health care provider about whether you are at high risk of being infected with HIV. If you choose to begin PrEP, you should first be tested for HIV. You should then be tested every 3 months for as long as you are taking PrEP. Pregnancy  If you are premenopausal and you may become pregnant, ask your health care provider about preconception counseling.  If you may become  pregnant, take 400 to 800 micrograms (mcg) of folic acid every day.  If you want to prevent pregnancy, talk to your health care provider about birth control (contraception). Osteoporosis and menopause  Osteoporosis is a disease in which the bones lose minerals and strength with aging. This can result in serious bone fractures. Your risk for osteoporosis can be identified using a bone density scan.  If you are 57 years of age or older, or if you are at risk for osteoporosis and fractures, ask your health care provider if you should be screened.  Ask your health care provider whether you should take a calcium or vitamin D supplement to lower your risk for osteoporosis.  Menopause may have certain physical symptoms and risks.  Hormone replacement therapy may reduce some of these symptoms and risks. Talk to your health care provider about whether hormone replacement therapy is right for you. Follow these instructions at home:  Schedule regular health, dental, and eye exams.  Stay current with your immunizations.  Do not use any tobacco products including cigarettes, chewing tobacco, or electronic cigarettes.  If you are pregnant, do not drink alcohol.  If you are breastfeeding, limit how much and how often you drink alcohol.  Limit alcohol intake to no more than 1 drink per day for nonpregnant women. One drink equals 12 ounces of beer, 5 ounces of wine, or 1 ounces of hard liquor.  Do not use street drugs.  Do not share needles.  Ask your health care  provider for help if you need support or information about quitting drugs.  Tell your health care provider if you often feel depressed.  Tell your health care provider if you have ever been abused or do not feel safe at home. This information is not intended to replace advice given to you by your health care provider. Make sure you discuss any questions you have with your health care provider. Document Released: 12/12/2010 Document Revised: 11/04/2015 Document Reviewed: 03/02/2015 Elsevier Interactive Patient Education  [employer].

## 2017-09-05 NOTE — Progress Notes (Signed)
Date:  09/05/2017   Name:  GRADY LUCCI   DOB:  February 26, 1933   MRN:  [id number]   Chief Complaint: Hypertension; Gastroesophageal Reflux; and Breast Exam Hypertension  This is a chronic problem. The problem is unchanged. The problem is controlled. Pertinent negatives include no chest pain, headaches, palpitations or shortness of breath. Past treatments include angiotensin blockers. The current treatment provides significant improvement.  Gastroesophageal Reflux  She complains of heartburn. She reports no abdominal pain, no chest pain, no coughing or no wheezing. This is a recurrent problem. The problem occurs occasionally. Pertinent negatives include no fatigue. She has tried a PPI (but is afraid of PPI - has not tried zantac) for the symptoms.   She is blind in one eye and needs to have it removed but is hesistant.  She is waiting until it is more consistently uncomfortable.   Review of Systems  Constitutional: Negative for chills, fatigue and fever.  HENT: Positive for hearing loss. Negative for congestion, tinnitus, trouble swallowing and voice change.   Eyes: Positive for visual disturbance (blind in left eye).  Respiratory: Negative for cough, chest tightness, shortness of breath and wheezing.   Cardiovascular: Negative for chest pain, palpitations and leg swelling.  Gastrointestinal: Positive for heartburn. Negative for abdominal pain, constipation, diarrhea and vomiting.  Endocrine: Negative for polydipsia and polyuria.  Genitourinary: Negative for dysuria, frequency, genital sores, vaginal bleeding and vaginal discharge.  Musculoskeletal: Positive for arthralgias and joint swelling (right hand > left). Negative for gait problem.  Skin: Negative for color change and rash.  Allergic/Immunologic: Negative for environmental allergies and food allergies.  Neurological: Negative for dizziness, tremors, light-headedness and headaches.  Hematological: Negative for adenopathy. Does not  bruise/bleed easily.  Psychiatric/Behavioral: Negative for dysphoric mood and sleep disturbance. The patient is not nervous/anxious.     Patient Active Problem List   Diagnosis Date Noted  . Blind left eye 09/04/2016  . Blindness of one eye with low vision in contralateral eye 04/08/2015  . Bradycardia 04/08/2015  . Central retinal vein occlusion 04/08/2015  . Essential (primary) hypertension 04/08/2015  . Gastro-esophageal reflux disease without esophagitis 04/08/2015  . Glaucoma 04/08/2015  . History of hearing problem 04/08/2015  . Hypercholesterolemia 04/08/2015  . Malignant neoplasm of colon ([place]) 04/08/2015    Prior to Admission medications   Medication Sig Start Date End Date Taking? Authorizing Provider  brimonidine-timolol (COMBIGAN) 0.2-0.5 % ophthalmic solution Apply to eye.   Yes [provider]  losartan (COZAAR) 50 MG tablet Take 0.5 tablets (25 mg total) by mouth daily. 09/04/16  Yes Glean Hess, MD    No Known Allergies  Past Surgical History:  Procedure Laterality Date  . APPENDECTOMY    . CATARACT EXTRACTION W/ INTRAOCULAR LENS IMPLANT Right 06/17/2015  . CERVIX LESION DESTRUCTION    . ESOPHAGOGASTRODUODENOSCOPY  2004   HH  . PARTIAL COLECTOMY  2004   colon cancer    Social History   Tobacco Use  . Smoking status: Never Smoker  . Smokeless tobacco: Never Used  . Tobacco comment: smoking cessation materials not required  Substance Use Topics  . Alcohol use: No    Alcohol/week: 0.0 oz  . Drug use: No     Medication list has been reviewed and updated.  PHQ 2/9 Scores 08/30/2017 09/04/2016 09/03/2015  PHQ - 2 Score 0 0 0  PHQ- 9 Score 0 - -    Physical Exam  Constitutional: She is oriented to person, place, and time. She  appears well-developed and well-nourished. No distress.  HENT:  Head: Normocephalic and atraumatic.  Right Ear: Tympanic membrane and ear canal normal.  Left Ear: Tympanic membrane and ear canal normal.  Nose:  Right sinus exhibits no maxillary sinus tenderness. Left sinus exhibits no maxillary sinus tenderness.  Mouth/Throat: Uvula is midline and oropharynx is clear and moist.  Eyes: Conjunctivae and EOM are normal. Right eye exhibits no discharge. Left eye exhibits no discharge. Left pupil is not reactive.  Neck: Normal range of motion. Carotid bruit is not present. No erythema present. No thyromegaly present.  Cardiovascular: Normal rate, regular rhythm, normal heart sounds and normal pulses.  Pulmonary/Chest: Effort normal. No respiratory distress. She has no wheezes. Right breast exhibits no mass, no nipple discharge, no skin change and no tenderness. Left breast exhibits no mass, no nipple discharge, no skin change and no tenderness.  Abdominal: Soft. Bowel sounds are normal. There is no hepatosplenomegaly. There is no tenderness. There is no CVA tenderness.  Musculoskeletal: Normal range of motion.  OA changes to right fingers - Heberden's and Bouchard's nodes.  Tinel's and Phalen's negative bilaterally  Lymphadenopathy:    She has no cervical adenopathy.    She has no axillary adenopathy.  Neurological: She is alert and oriented to person, place, and time. She has normal reflexes. No cranial nerve deficit or sensory deficit.  Skin: Skin is warm, dry and intact. No rash noted.  Psychiatric: She has a normal mood and affect. Her speech is normal and behavior is normal. Thought content normal.  Nursing note and vitals reviewed.   BP 134/78   Pulse 64   Ht 4\' 11"  (1.499 m)   Wt 103 lb (46.7 kg)   SpO2 98%   BMI 20.80 kg/m   Assessment and Plan: 1. Essential (primary) hypertension controlled - losartan (COZAAR) 25 MG tablet; Take 1 tablet (25 mg total) by mouth daily.  Dispense: 30 tablet; Refill: 12 - Comprehensive metabolic panel - TSH - POCT urinalysis dipstick - large bacteria but asx   2. Gastro-esophageal reflux disease without esophagitis May need to start H2 blocker - CBC  with Differential/Platelet  3. History of colon cancer in adulthood Pt declines further colonoscopies  4. Hypercholesterolemia Check labs Continue low fat diet - Lipid panel  5. Primary osteoarthritis of right hand Moderate rec Tylenol as needed   Meds ordered this encounter  Medications  . losartan (COZAAR) 25 MG tablet    Sig: Take 1 tablet (25 mg total) by mouth daily.    Dispense:  30 tablet    Refill:  12    Partially dictated using Editor, commissioning. Any errors are unintentional.  Halina Maidens, MD [facility]  09/05/2017

## 2017-09-06 LAB — CBC WITH DIFFERENTIAL/PLATELET
BASOS: 0 %
Basophils Absolute: 0 10*3/uL (ref 0.0–0.2)
EOS (ABSOLUTE): 0.1 10*3/uL (ref 0.0–0.4)
EOS: 2 %
Hematocrit: 40.1 % (ref 34.0–46.6)
Hemoglobin: 12.9 g/dL (ref 11.1–15.9)
IMMATURE GRANULOCYTES: 0 %
Immature Grans (Abs): 0 10*3/uL (ref 0.0–0.1)
LYMPHS ABS: 1.2 10*3/uL (ref 0.7–3.1)
Lymphs: 20 %
MCH: 30 pg (ref 26.6–33.0)
MCHC: 32.2 g/dL (ref 31.5–35.7)
MCV: 93 fL (ref 79–97)
MONOS ABS: 0.5 10*3/uL (ref 0.1–0.9)
Monocytes: 9 %
NEUTROS PCT: 69 %
Neutrophils Absolute: 4.2 10*3/uL (ref 1.4–7.0)
PLATELETS: 244 10*3/uL (ref 150–379)
RBC: 4.3 x10E6/uL (ref 3.77–5.28)
RDW: 13.6 % (ref 12.3–15.4)
WBC: 6.1 10*3/uL (ref 3.4–10.8)

## 2017-09-06 LAB — LIPID PANEL
CHOLESTEROL TOTAL: 234 mg/dL — AB (ref 100–199)
Chol/HDL Ratio: 2.6 ratio (ref 0.0–4.4)
HDL: 91 mg/dL (ref >39)
LDL CALC: 130 mg/dL — AB (ref 0–99)
TRIGLYCERIDES: 65 mg/dL (ref 0–149)
VLDL Cholesterol Cal: 13 mg/dL (ref 5–40)

## 2017-09-06 LAB — TSH: TSH: 3.99 u[IU]/mL (ref 0.450–4.500)

## 2017-09-06 LAB — COMPREHENSIVE METABOLIC PANEL
ALK PHOS: 113 IU/L (ref 39–117)
ALT: 7 IU/L (ref 0–32)
AST: 16 IU/L (ref 0–40)
Albumin/Globulin Ratio: 1.8 (ref 1.2–2.2)
Albumin: 4.4 g/dL (ref 3.5–4.7)
BILIRUBIN TOTAL: 0.4 mg/dL (ref 0.0–1.2)
BUN/Creatinine Ratio: 15 (ref 12–28)
BUN: 11 mg/dL (ref 8–27)
CHLORIDE: 101 mmol/L (ref 96–106)
CO2: 24 mmol/L (ref 20–29)
Calcium: 9.3 mg/dL (ref 8.7–10.3)
Creatinine, Ser: 0.74 mg/dL (ref 0.57–1.00)
GFR calc Af Amer: 87 mL/min/{1.73_m2} (ref >59)
GFR calc non Af Amer: 76 mL/min/{1.73_m2} (ref >59)
Globulin, Total: 2.4 g/dL (ref 1.5–4.5)
Glucose: 85 mg/dL (ref 65–99)
POTASSIUM: 4.3 mmol/L (ref 3.5–5.2)
Sodium: 142 mmol/L (ref 134–144)
Total Protein: 6.8 g/dL (ref 6.0–8.5)

## 2017-11-20 ENCOUNTER — Ambulatory Visit
Admission: RE | Admit: 2017-11-20 | Discharge: 2017-11-20 | Disposition: A | Discharge status: Home / Self Care | Payer: Medicare Other | Source: Ambulatory Visit | Attending: Internal Medicine | Admitting: Internal Medicine

## 2017-11-20 ENCOUNTER — Encounter: Payer: Self-pay | Admitting: Internal Medicine

## 2017-11-20 ENCOUNTER — Ambulatory Visit (INDEPENDENT_AMBULATORY_CARE_PROVIDER_SITE_OTHER): Payer: Medicare Other | Admitting: Internal Medicine

## 2017-11-20 VITALS — BP 102/76 | HR 64 | Resp 16 | Ht 59.0 in | Wt 101.0 lb

## 2017-11-20 DIAGNOSIS — M1712 Unilateral primary osteoarthritis, left knee: Secondary | ICD-10-CM | POA: Diagnosis not present

## 2017-11-20 DIAGNOSIS — G8929 Other chronic pain: Secondary | ICD-10-CM

## 2017-11-20 DIAGNOSIS — M25562 Pain in left knee: Principal | ICD-10-CM

## 2017-11-20 DIAGNOSIS — I10 Essential (primary) hypertension: Secondary | ICD-10-CM | POA: Diagnosis not present

## 2017-11-20 NOTE — Patient Instructions (Signed)
Tylenol 325 mg - take 1-2 three times a day for knee pain

## 2017-11-20 NOTE — Progress Notes (Signed)
Date:  11/20/2017   Name:  Tammy Mcgee   DOB:  [birth year]   MRN:  [id number]   Chief Complaint: Knee Pain (left knee, 6-7 weeks ) Knee Pain   The incident occurred more than 1 week ago. There was no injury mechanism. The pain is present in the left knee. The quality of the pain is described as aching. The pain is moderate. The pain has been worsening since onset. Pertinent negatives include no loss of motion, muscle weakness or numbness. She reports no foreign bodies present. The symptoms are aggravated by movement and weight bearing. She has tried nothing for the symptoms.  Hypertension  This is a chronic problem. The problem is controlled. Pertinent negatives include no chest pain, headaches, palpitations or shortness of breath. Past treatments include angiotensin blockers. The current treatment provides significant improvement. Compliance problems: takes medication every other day.       Review of Systems  Constitutional: Negative for chills and fatigue.  Respiratory: Negative for chest tightness, shortness of breath and wheezing.   Cardiovascular: Negative for chest pain, palpitations and leg swelling.  Musculoskeletal: Positive for arthralgias and gait problem. Negative for joint swelling and myalgias.  Neurological: Negative for dizziness, weakness, numbness and headaches.    Patient Active Problem List   Diagnosis Date Noted  . Primary osteoarthritis of right hand 09/05/2017  . Blind left eye 09/04/2016  . Blindness of one eye with low vision in contralateral eye 04/08/2015  . Bradycardia 04/08/2015  . Central retinal vein occlusion 04/08/2015  . Essential (primary) hypertension 04/08/2015  . Gastro-esophageal reflux disease without esophagitis 04/08/2015  . Glaucoma 04/08/2015  . History of hearing problem 04/08/2015  . Hypercholesterolemia 04/08/2015  . History of colon cancer in adulthood 04/08/2015    Prior to Admission medications   Medication Sig Start Date  End Date Taking? Authorizing Provider  brimonidine-timolol (COMBIGAN) 0.2-0.5 % ophthalmic solution Apply to eye.   Yes [provider]  losartan (COZAAR) 25 MG tablet Take 1 tablet (25 mg total) by mouth daily. 09/05/17  Yes Glean Hess, MD    No Known Allergies  Past Surgical History:  Procedure Laterality Date  . APPENDECTOMY    . CATARACT EXTRACTION W/ INTRAOCULAR LENS IMPLANT Right 06/17/2015  . CERVIX LESION DESTRUCTION    . ESOPHAGOGASTRODUODENOSCOPY  2004   HH  . PARTIAL COLECTOMY  2004   colon cancer    Social History   Tobacco Use  . Smoking status: Never Smoker  . Smokeless tobacco: Never Used  . Tobacco comment: smoking cessation materials not required  Substance Use Topics  . Alcohol use: No    Alcohol/week: 0.0 oz  . Drug use: No     Medication list has been reviewed and updated.  Current Meds  Medication Sig  . brimonidine-timolol (COMBIGAN) 0.2-0.5 % ophthalmic solution Apply to eye.  . losartan (COZAAR) 25 MG tablet Take 1 tablet (25 mg total) by mouth daily.    PHQ 2/9 Scores 08/30/2017 09/04/2016 09/03/2015  PHQ - 2 Score 0 0 0  PHQ- 9 Score 0 - -    Physical Exam  Constitutional: She is oriented to person, place, and time. She appears well-developed. No distress.  HENT:  Head: Normocephalic and atraumatic.  Neck: Normal range of motion. Neck supple.  Cardiovascular: Normal rate, regular rhythm and normal heart sounds.  Pulmonary/Chest: Effort normal and breath sounds normal. No respiratory distress.  Musculoskeletal:       Left knee: She exhibits normal  range of motion, no swelling and no effusion. Tenderness (posteriorly) found.  Neurological: She is alert and oriented to person, place, and time. She has normal strength. No sensory deficit. Gait (slighty limp on left noted) abnormal.  Skin: Skin is warm and dry. No rash noted.  Psychiatric: She has a normal mood and affect. Her behavior is normal. Thought content normal.  Nursing  note and vitals reviewed.   BP 102/76   Pulse 64   Resp 16   Ht 4\' 11"  (1.499 m)   Wt 101 lb (45.8 kg)   SpO2 98%   BMI 20.40 kg/m   Assessment and Plan: 1. Chronic pain of left knee Recommend tylenol as needed for pain - DG Knee 3 Views Left; Future  2. Essential (primary) hypertension Controlled - continue ARB   No orders of the defined types were placed in this encounter.   Partially dictated using Editor, commissioning. Any errors are unintentional.  Halina Maidens, MD [facility]  11/20/2017   There are no diagnoses linked to this encounter.

## 2017-12-20 DIAGNOSIS — D485 Neoplasm of uncertain behavior of skin: Secondary | ICD-10-CM | POA: Diagnosis not present

## 2017-12-20 DIAGNOSIS — C44629 Squamous cell carcinoma of skin of left upper limb, including shoulder: Secondary | ICD-10-CM | POA: Diagnosis not present

## 2018-01-01 DIAGNOSIS — H401123 Primary open-angle glaucoma, left eye, severe stage: Secondary | ICD-10-CM | POA: Diagnosis not present

## 2018-01-01 DIAGNOSIS — H401112 Primary open-angle glaucoma, right eye, moderate stage: Secondary | ICD-10-CM | POA: Diagnosis not present

## 2018-01-01 DIAGNOSIS — H4089 Other specified glaucoma: Secondary | ICD-10-CM | POA: Diagnosis not present

## 2018-01-01 DIAGNOSIS — H04121 Dry eye syndrome of right lacrimal gland: Secondary | ICD-10-CM | POA: Diagnosis not present

## 2018-01-01 DIAGNOSIS — H348122 Central retinal vein occlusion, left eye, stable: Secondary | ICD-10-CM | POA: Diagnosis not present

## 2018-01-01 DIAGNOSIS — H01029 Squamous blepharitis unspecified eye, unspecified eyelid: Secondary | ICD-10-CM | POA: Diagnosis not present

## 2018-01-01 DIAGNOSIS — H259 Unspecified age-related cataract: Secondary | ICD-10-CM | POA: Diagnosis not present

## 2018-01-01 DIAGNOSIS — H2589 Other age-related cataract: Secondary | ICD-10-CM | POA: Diagnosis not present

## 2018-01-01 DIAGNOSIS — Z961 Presence of intraocular lens: Secondary | ICD-10-CM | POA: Diagnosis not present

## 2018-01-08 DIAGNOSIS — C44629 Squamous cell carcinoma of skin of left upper limb, including shoulder: Secondary | ICD-10-CM | POA: Diagnosis not present

## 2018-04-10 DIAGNOSIS — L570 Actinic keratosis: Secondary | ICD-10-CM | POA: Diagnosis not present

## 2018-04-10 DIAGNOSIS — L821 Other seborrheic keratosis: Secondary | ICD-10-CM | POA: Diagnosis not present

## 2018-04-10 DIAGNOSIS — Z85828 Personal history of other malignant neoplasm of skin: Secondary | ICD-10-CM | POA: Diagnosis not present

## 2018-05-01 DIAGNOSIS — H5712 Ocular pain, left eye: Secondary | ICD-10-CM | POA: Diagnosis not present

## 2018-05-01 DIAGNOSIS — H5442A5 Blindness left eye category 5, normal vision right eye: Secondary | ICD-10-CM | POA: Diagnosis not present

## 2018-05-01 DIAGNOSIS — H409 Unspecified glaucoma: Secondary | ICD-10-CM | POA: Diagnosis not present

## 2018-06-13 DIAGNOSIS — H401113 Primary open-angle glaucoma, right eye, severe stage: Secondary | ICD-10-CM | POA: Diagnosis not present

## 2018-09-02 ENCOUNTER — Telehealth: Payer: Self-pay | Admitting: Internal Medicine

## 2018-09-02 NOTE — Telephone Encounter (Signed)
Called patient to cancel her Medicare Annual Wellness Visit with the [facility] at [facility] on Wednesday, 09/04/2018 at 10:00 AM.  No answer on patient's phone and no message except to leave a remote access code.  I wasn't sure what this was, but I did enter my call back number [phone].  If patient calls back, please cancel her AWV appointment on 09/04/2018 at 10:00 AM.  Thank you, Janace Hoard, Care Guide.

## 2018-09-03 NOTE — Telephone Encounter (Signed)
Tried to call patient to cancel her AWV appointment for Wednesday, September 04, 2018 at 10:00 AM.  No answer at the number in the computer and no machine to leave a message.  I also tried to call her Patient contact, Pete Glatter (daughter) at [phone] and gentleman that answered states I have the wrong number.  If patient calls back, please cancel her AWV for 09/04/2018 and reschedule.  Thank you, Janace Hoard, Care Guide.

## 2018-09-04 ENCOUNTER — Ambulatory Visit: Payer: Medicare Other

## 2018-10-14 DIAGNOSIS — H401133 Primary open-angle glaucoma, bilateral, severe stage: Secondary | ICD-10-CM | POA: Diagnosis not present

## 2018-10-14 DIAGNOSIS — H5712 Ocular pain, left eye: Secondary | ICD-10-CM | POA: Diagnosis not present

## 2018-10-14 DIAGNOSIS — H5440 Blindness, one eye, unspecified eye: Secondary | ICD-10-CM | POA: Diagnosis not present

## 2018-10-14 DIAGNOSIS — H5710 Ocular pain, unspecified eye: Secondary | ICD-10-CM | POA: Diagnosis not present

## 2018-11-15 ENCOUNTER — Other Ambulatory Visit: Payer: Self-pay | Admitting: Internal Medicine

## 2018-11-15 DIAGNOSIS — I10 Essential (primary) hypertension: Secondary | ICD-10-CM

## 2018-11-15 DIAGNOSIS — Z1159 Encounter for screening for other viral diseases: Secondary | ICD-10-CM | POA: Diagnosis not present

## 2018-11-18 ENCOUNTER — Telehealth: Payer: Self-pay

## 2018-11-18 DIAGNOSIS — H5712 Ocular pain, left eye: Secondary | ICD-10-CM | POA: Diagnosis not present

## 2018-11-18 DIAGNOSIS — H5710 Ocular pain, unspecified eye: Secondary | ICD-10-CM | POA: Diagnosis not present

## 2018-11-18 DIAGNOSIS — K219 Gastro-esophageal reflux disease without esophagitis: Secondary | ICD-10-CM | POA: Diagnosis not present

## 2018-11-18 DIAGNOSIS — I10 Essential (primary) hypertension: Secondary | ICD-10-CM | POA: Diagnosis not present

## 2018-11-18 DIAGNOSIS — H5440 Blindness, one eye, unspecified eye: Secondary | ICD-10-CM | POA: Diagnosis not present

## 2018-11-18 DIAGNOSIS — H179 Unspecified corneal scar and opacity: Secondary | ICD-10-CM | POA: Diagnosis not present

## 2018-11-18 DIAGNOSIS — H4010X0 Unspecified open-angle glaucoma, stage unspecified: Secondary | ICD-10-CM | POA: Diagnosis not present

## 2018-11-18 DIAGNOSIS — Z1159 Encounter for screening for other viral diseases: Secondary | ICD-10-CM | POA: Diagnosis not present

## 2018-11-18 DIAGNOSIS — H2012 Chronic iridocyclitis, left eye: Secondary | ICD-10-CM | POA: Diagnosis not present

## 2018-11-18 HISTORY — PX: EYE SURGERY: SHX253

## 2018-11-18 NOTE — Telephone Encounter (Signed)
Patient called saying we tried to call to get her an appt. She said she is on the way to the hospital now for surgery to have her eye ball removed. Will call after recovery to schedule a CPE and AWV.  FYI

## 2018-11-18 NOTE — Telephone Encounter (Signed)
LM to call back.

## 2018-11-26 DIAGNOSIS — Z85828 Personal history of other malignant neoplasm of skin: Secondary | ICD-10-CM | POA: Diagnosis not present

## 2018-11-26 DIAGNOSIS — L821 Other seborrheic keratosis: Secondary | ICD-10-CM | POA: Diagnosis not present

## 2018-12-11 ENCOUNTER — Ambulatory Visit (INDEPENDENT_AMBULATORY_CARE_PROVIDER_SITE_OTHER): Payer: Medicare Other

## 2018-12-11 ENCOUNTER — Other Ambulatory Visit: Payer: Self-pay

## 2018-12-11 VITALS — BP 92/62 | HR 61 | Temp 98.0°F | Resp 16 | Ht 59.0 in | Wt 96.2 lb

## 2018-12-11 DIAGNOSIS — Z0000 Encounter for general adult medical examination without abnormal findings: Secondary | ICD-10-CM | POA: Diagnosis not present

## 2018-12-11 NOTE — Progress Notes (Signed)
Subjective:   Tammy Mcgee is a 85 y.o. female who presents for Medicare Annual (Subsequent) preventive examination.  Review of Systems:   Cardiac Risk Factors include: advanced age (>69men, >21 women);hypertension     Objective:     Vitals: BP 92/62 (BP Location: Left Arm, Patient Position: Sitting, Cuff Size: Normal)   Pulse 61   Temp 98 F (36.7 C) (Oral)   Resp 16   Ht 4\' 11"  (1.499 m)   Wt 96 lb 3.2 oz (43.6 kg)   SpO2 98%   BMI 19.43 kg/m   Body mass index is 19.43 kg/m.  Advanced Directives 12/11/2018 08/30/2017 09/04/2016 09/03/2015  Does Patient Have a Medical Advance Directive? No No No No  Would patient like information on creating a medical advance directive? No - Patient declined Yes (MAU/Ambulatory/Procedural Areas - Information given) - No - patient declined information    Tobacco Social History   Tobacco Use  Smoking Status Never Smoker  Smokeless Tobacco Never Used  Tobacco Comment   smoking cessation materials not required     Counseling given: Not Answered Comment: smoking cessation materials not required   Clinical Intake:  Pre-visit preparation completed: Yes  Pain : No/denies pain     BMI - recorded: 19.43 Nutritional Status: BMI of 19-24  Normal Nutritional Risks: None Diabetes: No  How often do you need to have someone help you when you read instructions, pamphlets, or other written materials from your doctor or pharmacy?: 1 - Never  Interpreter Needed?: No  Information entered by :: Clemetine Marker LPN  Past Medical History:  Diagnosis Date  . GERD (gastroesophageal reflux disease)   . Glaucoma   . Hyperlipidemia   . Hypertension   . Legally blind in left eye, as defined in [place]    Past Surgical History:  Procedure Laterality Date  . APPENDECTOMY    . CATARACT EXTRACTION W/ INTRAOCULAR LENS IMPLANT Right 06/17/2015  . CERVIX LESION DESTRUCTION    . ESOPHAGOGASTRODUODENOSCOPY  2004   HH  . EYE SURGERY  11/18/2018   left  eye removed due to glaucoma  . PARTIAL COLECTOMY  2004   colon cancer   Family History  Problem Relation Age of Onset  . Hypertension Mother   . CAD Father   . Heart attack Father   . Stroke Sister    Social History   Socioeconomic History  . Marital status: Divorced    Spouse name: Not on file  . Number of children: 4  . Years of education: GED  . Highest education level: 8th grade  Occupational History  . Occupation: Retired  Scientific laboratory technician  . Financial resource strain: Not hard at all  . Food insecurity    Worry: Never true    Inability: Never true  . Transportation needs    Medical: No    Non-medical: No  Tobacco Use  . Smoking status: Never Smoker  . Smokeless tobacco: Never Used  . Tobacco comment: smoking cessation materials not required  Substance and Sexual Activity  . Alcohol use: No    Alcohol/week: 0.0 standard drinks  . Drug use: No  . Sexual activity: Not Currently  Lifestyle  . Physical activity    Days per week: 0 days    Minutes per session: 0 min  . Stress: Not at all  Relationships  . Social connections    Talks on phone: More than three times a week    Gets together: More than three times a  week    Attends religious service: Never    Active member of club or organization: No    Attends meetings of clubs or organizations: Never    Relationship status: Divorced  Other Topics Concern  . Not on file  Social History Narrative  . Not on file    Outpatient Encounter Medications as of 12/11/2018  Medication Sig  . brimonidine-timolol (COMBIGAN) 0.2-0.5 % ophthalmic solution Apply to eye.  . losartan (COZAAR) 25 MG tablet Take 1 tablet by mouth once daily  . tobramycin-dexamethasone (TOBRADEX) ophthalmic solution INSTILL 1 DROP INTO LEFT EYE 4 TIMES DAILY FOR 21 DAYS   No facility-administered encounter medications on file as of 12/11/2018.     Activities of Daily Living In your present state of health, do you have any difficulty performing the  following activities: 12/11/2018  Hearing? Y  Comment does not wear her hearing aids  Vision? Y  Difficulty concentrating or making decisions? Y  Walking or climbing stairs? N  Dressing or bathing? N  Doing errands, shopping? N  Preparing Food and eating ? N  Using the Toilet? N  In the past six months, have you accidently leaked urine? N  Do you have problems with loss of bowel control? N  Managing your Medications? N  Managing your Finances? N  Housekeeping or managing your Housekeeping? N  Some recent data might be hidden    Patient Care Team: Glean Hess, MD as PCP - General (Internal Medicine)    Assessment:   This is a routine wellness examination for Haylyn.  Exercise Activities and Dietary recommendations Current Exercise Habits: The patient does not participate in regular exercise at present, Exercise limited by: None identified  Goals    . DIET - INCREASE WATER INTAKE     Recommend to drink at least 6-8 8oz glasses of water per day.       Fall Risk Fall Risk  12/11/2018 08/30/2017 09/04/2016 09/03/2015  Falls in the past year? 1 Yes Yes Yes  Comment - fell after cutting logs. States she tripped over log and fell on to ground - -  Number falls in past yr: 1 2 or more 2 or more 2 or more  Injury with Fall? 0 Yes No No  Risk Factor Category  - High Fall Risk High Fall Risk -  Risk for fall due to : Impaired vision Impaired balance/gait;History of fall(s);Impaired vision - -  Risk for fall due to: Comment - staggering gait; blind left eye - -  Follow up Falls prevention discussed Falls evaluation completed;Education provided;Falls prevention discussed - Falls evaluation completed   FALL RISK PREVENTION PERTAINING TO THE HOME:  Any stairs in or around the home? Yes  If so, do they handrails? Yes   Home free of loose throw rugs in walkways, pet beds, electrical cords, etc? Yes  Adequate lighting in your home to reduce risk of falls? Yes   ASSISTIVE DEVICES  UTILIZED TO PREVENT FALLS:  Life alert? No  Use of a cane, walker or w/c? No  Grab bars in the bathroom? No  Shower chair or bench in shower? No  Elevated toilet seat or a handicapped toilet? No   DME ORDERS:  DME order needed?  No   TIMED UP AND GO:  Was the test performed? Yes .  Length of time to ambulate 10 feet: 5 sec.   GAIT:  Appearance of gait: Gait stead-fast and without the use of an assistive device.   Education:  Fall risk prevention has been discussed.  Intervention(s) required? No    Depression Screen PHQ 2/9 Scores 12/11/2018 08/30/2017 09/04/2016 09/03/2015  PHQ - 2 Score 0 0 0 0  PHQ- 9 Score - 0 - -     Cognitive Function     6CIT Screen 12/11/2018 08/30/2017 09/04/2016  What Year? 0 points 0 points 0 points  What month? 0 points 0 points 0 points  What time? 0 points 0 points 0 points  Count back from 20 0 points 0 points 0 points  Months in reverse 0 points 0 points 0 points  Repeat phrase 2 points 8 points 0 points  Total Score 2 8 0    Immunization History  Administered Date(s) Administered  . Tdap 09/04/2016    Qualifies for Shingles Vaccine? Yes . Due for Shingrix. Education has been provided regarding the importance of this vaccine. Pt has been advised to call insurance company to determine out of pocket expense. Advised may also receive vaccine at local pharmacy or Health Dept. Verbalized acceptance and understanding.  Tdap: Up to date  Flu Vaccine: Due for Flu vaccine. Does the patient want to receive this vaccine today?  No . Education has been provided regarding the importance of this vaccine but still declined. Advised may receive this vaccine at local pharmacy or Health Dept. Aware to provide a copy of the vaccination record if obtained from local pharmacy or Health Dept. Verbalized acceptance and understanding.  Pneumococcal Vaccine: Due for Pneumococcal vaccine. Does the patient want to receive this vaccine today?  No . Education has been  provided regarding the importance of this vaccine but still declined. Advised may receive this vaccine at local pharmacy or Health Dept. Aware to provide a copy of the vaccination record if obtained from local pharmacy or Health Dept. Verbalized acceptance and understanding.   Screening Tests Health Maintenance  Topic Date Due  . PNA vac Low Risk Adult (1 of 2 - PCV13) 09/02/2020 (Originally 05/25/2000)  . INFLUENZA VACCINE  01/11/2019  . TETANUS/TDAP  09/05/2026  . DEXA SCAN  Discontinued    Cancer Screenings:  Colorectal Screening: No longer required.   Mammogram: Completed 12/05/12. No longer required.  Bone Density: Completed 05/25/2000. Results reflect unavailable. Pt declines repeat screening.   Lung Cancer Screening: (Low Dose CT Chest recommended if Age 20-80 years, 30 pack-year currently smoking OR have quit w/in 15years.) does not qualify.    Additional Screening:  Hepatitis C Screening: no longer required  Vision Screening: Recommended annual ophthalmology exams for early detection of glaucoma and other disorders of the eye. Is the patient up to date with their annual eye exam?  Yes  Who is the provider or what is the name of the office in which the pt attends annual eye exams? Dr. Lyn Records  Dental Screening: Recommended annual dental exams for proper oral hygiene  Community Resource Referral:  CRR required this visit?  No       Plan:    I have personally reviewed and addressed the Medicare Annual Wellness questionnaire and have noted the following in the patient's chart:  A. Medical and social history B. Use of alcohol, tobacco or illicit drugs  C. Current medications and supplements D. Functional ability and status E.  Nutritional status F.  Physical activity G. Advance directives H. List of other physicians I.  Hospitalizations, surgeries, and ER visits in previous 12 months J.  [place] such as hearing and vision if needed, cognitive and  depression  L. Referrals and appointments   In addition, I have reviewed and discussed with patient certain preventive protocols, quality metrics, and best practice recommendations. A written personalized care plan for preventive services as well as general preventive health recommendations were provided to patient.   Signed,  Clemetine Marker, LPN Nurse Health Advisor   Nurse Notes: Pt concerned about losing 5 more pounds over the past year and not changing eating habits. Pt had left eye removed 11/18/18 and has been feeling a little weak but getting better and vision has improved in right eye. Advised pt to schedule CPE with Dr. Army Melia

## 2018-12-11 NOTE — Patient Instructions (Signed)
Tammy Mcgee , Thank you for taking time to come for your Medicare Wellness Visit. I appreciate your ongoing commitment to your health goals. Please review the following plan we discussed and let me know if I can assist you in the future.   Screening recommendations/referrals: Colonoscopy: no longer required Mammogram: no longer required Bone Density: no longer required Recommended yearly ophthalmology/optometry visit for glaucoma screening and checkup Recommended yearly dental visit for hygiene and checkup  Vaccinations: Influenza vaccine: postponed Pneumococcal vaccine: postponed Tdap vaccine: done 09/04/16 Shingles vaccine: Shingrix discussed. Please contact your pharmacy for coverage information.   Advanced directives: Please bring a copy of your health care power of attorney and living will to the office at your convenience once you have completed those documents.   Conditions/risks identified: recommend increasing physical activity once tolerated  Next appointment: Please follow up in one year for your Medicare Annual Wellness visit.     Preventive Care 33 Years and Older, Female Preventive care refers to lifestyle choices and visits with your health care provider that can promote health and wellness. What does preventive care include?  A yearly physical exam. This is also called an annual well check.  Dental exams once or twice a year.  Routine eye exams. Ask your health care provider how often you should have your eyes checked.  Personal lifestyle choices, including:  Daily care of your teeth and gums.  Regular physical activity.  Eating a healthy diet.  Avoiding tobacco and drug use.  Limiting alcohol use.  Practicing safe sex.  Taking low-dose aspirin every day.  Taking vitamin and mineral supplements as recommended by your health care provider. What happens during an annual well check? The services and screenings done by your health care provider during  your annual well check will depend on your age, overall health, lifestyle risk factors, and family history of disease. Counseling  Your health care provider may ask you questions about your:  Alcohol use.  Tobacco use.  Drug use.  Emotional well-being.  Home and relationship well-being.  Sexual activity.  Eating habits.  History of falls.  Memory and ability to understand (cognition).  Work and work Statistician.  Reproductive health. Screening  You may have the following tests or measurements:  Height, weight, and BMI.  Blood pressure.  Lipid and cholesterol levels. These may be checked every 5 years, or more frequently if you are over 42 years old.  Skin check.  Lung cancer screening. You may have this screening every year starting at age 82 if you have a 30-pack-year history of smoking and currently smoke or have quit within the past 15 years.  Fecal occult blood test (FOBT) of the stool. You may have this test every year starting at age 49.  Flexible sigmoidoscopy or colonoscopy. You may have a sigmoidoscopy every 5 years or a colonoscopy every 10 years starting at age 24.  Hepatitis C blood test.  Hepatitis B blood test.  Sexually transmitted disease (STD) testing.  Diabetes screening. This is done by checking your blood sugar (glucose) after you have not eaten for a while (fasting). You may have this done every 1-3 years.  Bone density scan. This is done to screen for osteoporosis. You may have this done starting at age 48.  Mammogram. This may be done every 1-2 years. Talk to your health care provider about how often you should have regular mammograms. Talk with your health care provider about your test results, treatment options, and if necessary, the need  for more tests. Vaccines  Your health care provider may recommend certain vaccines, such as:  Influenza vaccine. This is recommended every year.  Tetanus, diphtheria, and acellular pertussis (Tdap,  Td) vaccine. You may need a Td booster every 10 years.  Zoster vaccine. You may need this after age 30.  Pneumococcal 13-valent conjugate (PCV13) vaccine. One dose is recommended after age 52.  Pneumococcal polysaccharide (PPSV23) vaccine. One dose is recommended after age 61. Talk to your health care provider about which screenings and vaccines you need and how often you need them. This information is not intended to replace advice given to you by your health care provider. Make sure you discuss any questions you have with your health care provider. Document Released: 06/25/2015 Document Revised: 02/16/2016 Document Reviewed: 03/30/2015 Elsevier Interactive Patient Education  2017 [place] Prevention in the Home Falls can cause injuries. They can happen to people of all ages. There are many things you can do to make your home safe and to help prevent falls. What can I do on the outside of my home?  Regularly fix the edges of walkways and driveways and fix any cracks.  Remove anything that might make you trip as you walk through a door, such as a raised step or threshold.  Trim any bushes or trees on the path to your home.  Use bright outdoor lighting.  Clear any walking paths of anything that might make someone trip, such as rocks or tools.  Regularly check to see if handrails are loose or broken. Make sure that both sides of any steps have handrails.  Any raised decks and porches should have guardrails on the edges.  Have any leaves, snow, or ice cleared regularly.  Use sand or salt on walking paths during winter.  Clean up any spills in your garage right away. This includes oil or grease spills. What can I do in the bathroom?  Use night lights.  Install grab bars by the toilet and in the tub and shower. Do not use towel bars as grab bars.  Use non-skid mats or decals in the tub or shower.  If you need to sit down in the shower, use a plastic, non-slip stool.   Keep the floor dry. Clean up any water that spills on the floor as soon as it happens.  Remove soap buildup in the tub or shower regularly.  Attach bath mats securely with double-sided non-slip rug tape.  Do not have throw rugs and other things on the floor that can make you trip. What can I do in the bedroom?  Use night lights.  Make sure that you have a light by your bed that is easy to reach.  Do not use any sheets or blankets that are too big for your bed. They should not hang down onto the floor.  Have a firm chair that has side arms. You can use this for support while you get dressed.  Do not have throw rugs and other things on the floor that can make you trip. What can I do in the kitchen?  Clean up any spills right away.  Avoid walking on wet floors.  Keep items that you use a lot in easy-to-reach places.  If you need to reach something above you, use a strong step stool that has a grab bar.  Keep electrical cords out of the way.  Do not use floor polish or wax that makes floors slippery. If you must use wax, use  non-skid floor wax.  Do not have throw rugs and other things on the floor that can make you trip. What can I do with my stairs?  Do not leave any items on the stairs.  Make sure that there are handrails on both sides of the stairs and use them. Fix handrails that are broken or loose. Make sure that handrails are as long as the stairways.  Check any carpeting to make sure that it is firmly attached to the stairs. Fix any carpet that is loose or worn.  Avoid having throw rugs at the top or bottom of the stairs. If you do have throw rugs, attach them to the floor with carpet tape.  Make sure that you have a light switch at the top of the stairs and the bottom of the stairs. If you do not have them, ask someone to add them for you. What else can I do to help prevent falls?  Wear shoes that:  Do not have high heels.  Have rubber bottoms.  Are  comfortable and fit you well.  Are closed at the toe. Do not wear sandals.  If you use a stepladder:  Make sure that it is fully opened. Do not climb a closed stepladder.  Make sure that both sides of the stepladder are locked into place.  Ask someone to hold it for you, if possible.  Clearly mark and make sure that you can see:  Any grab bars or handrails.  First and last steps.  Where the edge of each step is.  Use tools that help you move around (mobility aids) if they are needed. These include:  Canes.  Walkers.  Scooters.  Crutches.  Turn on the lights when you go into a dark area. Replace any light bulbs as soon as they burn out.  Set up your furniture so you have a clear path. Avoid moving your furniture around.  If any of your floors are uneven, fix them.  If there are any pets around you, be aware of where they are.  Review your medicines with your doctor. Some medicines can make you feel dizzy. This can increase your chance of falling. Ask your doctor what other things that you can do to help prevent falls. This information is not intended to replace advice given to you by your health care provider. Make sure you discuss any questions you have with your health care provider. Document Released: 03/25/2009 Document Revised: 11/04/2015 Document Reviewed: 07/03/2014 Elsevier Interactive Patient Education  2017 [employer].

## 2019-01-01 DIAGNOSIS — H5712 Ocular pain, left eye: Secondary | ICD-10-CM | POA: Diagnosis not present

## 2019-01-01 DIAGNOSIS — H5442A5 Blindness left eye category 5, normal vision right eye: Secondary | ICD-10-CM | POA: Diagnosis not present

## 2019-01-06 ENCOUNTER — Telehealth: Payer: Self-pay | Admitting: Internal Medicine

## 2019-01-06 ENCOUNTER — Ambulatory Visit (INDEPENDENT_AMBULATORY_CARE_PROVIDER_SITE_OTHER): Payer: Medicare Other | Admitting: Internal Medicine

## 2019-01-06 ENCOUNTER — Encounter: Payer: Self-pay | Admitting: Internal Medicine

## 2019-01-06 ENCOUNTER — Other Ambulatory Visit: Payer: Self-pay

## 2019-01-06 VITALS — BP 116/68 | HR 61 | Ht 59.0 in | Wt 95.2 lb

## 2019-01-06 DIAGNOSIS — R8271 Bacteriuria: Secondary | ICD-10-CM

## 2019-01-06 DIAGNOSIS — E7800 Pure hypercholesterolemia, unspecified: Secondary | ICD-10-CM | POA: Diagnosis not present

## 2019-01-06 DIAGNOSIS — R634 Abnormal weight loss: Secondary | ICD-10-CM | POA: Diagnosis not present

## 2019-01-06 DIAGNOSIS — K219 Gastro-esophageal reflux disease without esophagitis: Secondary | ICD-10-CM | POA: Diagnosis not present

## 2019-01-06 DIAGNOSIS — H5410 Blindness, one eye, low vision other eye, unspecified eyes: Secondary | ICD-10-CM | POA: Diagnosis not present

## 2019-01-06 DIAGNOSIS — I10 Essential (primary) hypertension: Secondary | ICD-10-CM | POA: Diagnosis not present

## 2019-01-06 LAB — POCT URINALYSIS DIPSTICK
Bilirubin, UA: NEGATIVE
Glucose, UA: NEGATIVE
Ketones, UA: NEGATIVE
Leukocytes, UA: NEGATIVE
Nitrite, UA: POSITIVE
Protein, UA: NEGATIVE
Spec Grav, UA: 1.015 (ref 1.010–1.025)
Urobilinogen, UA: 0.2 E.U./dL
pH, UA: 6 (ref 5.0–8.0)

## 2019-01-06 MED ORDER — SULFAMETHOXAZOLE-TRIMETHOPRIM 800-160 MG PO TABS: 1.0000 | ORAL_TABLET | Freq: Two times a day (BID) | ORAL | 0 refills | Status: AC

## 2019-01-06 MED ORDER — LOSARTAN POTASSIUM 25 MG PO TABS: 25.0000 mg | ORAL_TABLET | Freq: Every day | ORAL | 3 refills | Status: DC

## 2019-01-06 NOTE — Telephone Encounter (Signed)
Called and spoke with pt and informed. She will take full course of abx.

## 2019-01-06 NOTE — Progress Notes (Signed)
Date:  01/06/2019   Name:  Tammy Mcgee   DOB:  1932-11-27   MRN:  [id number]   Chief Complaint: Medicare Yearly (HTN follow up, breast exam.) Tammy Mcgee is a 85 y.o. female who presents today for her annual exam. She feels well. She reports exercising none. She reports she is sleeping well. She reports doing well since her left eye surgery - no more pain or need to use drops.  She is staying active in the house and garden.  She reports losing some weight but is not concerned.  On review of diet, she eats very little protein, mainly fruits and vegetables and sweets.  She says that she can not eat between meals because it makes her stomach hurts.  She rarely eats meat over concerns of constipation.  Pneumonia vaccine - declined Tdap 2018 Mammogram - aged out  Hypertension This is a chronic problem. The problem is controlled. Pertinent negatives include no chest pain, headaches, palpitations or shortness of breath. Past treatments include angiotensin blockers. The current treatment provides significant improvement. Compliance problems: only takes losartan every other day.    Lab Results  Component Value Date   CREATININE 0.74 09/05/2017   BUN 11 09/05/2017   NA 142 09/05/2017   K 4.3 09/05/2017   CL 101 09/05/2017   CO2 24 09/05/2017   Lab Results  Component Value Date   TSH 3.990 09/05/2017   Lab Results  Component Value Date   WBC 6.1 09/05/2017   HGB 12.9 09/05/2017   HCT 40.1 09/05/2017   MCV 93 09/05/2017   PLT 244 09/05/2017     Review of Systems  Constitutional: Positive for unexpected weight change. Negative for chills, fatigue and fever.  HENT: Negative for congestion, hearing loss, tinnitus, trouble swallowing and voice change.   Eyes: Negative for visual disturbance.  Respiratory: Negative for cough, chest tightness, shortness of breath and wheezing.   Cardiovascular: Negative for chest pain, palpitations and leg swelling.  Gastrointestinal: Negative  for abdominal pain, constipation, diarrhea and vomiting.  Endocrine: Negative for polydipsia and polyuria.  Genitourinary: Negative for dysuria, frequency, genital sores, vaginal bleeding and vaginal discharge.  Musculoskeletal: Negative for arthralgias, gait problem and joint swelling.  Skin: Negative for color change and rash.  Neurological: Negative for dizziness, tremors, light-headedness and headaches.  Hematological: Negative for adenopathy. Does not bruise/bleed easily.  Psychiatric/Behavioral: Positive for sleep disturbance. Negative for dysphoric mood. The patient is not nervous/anxious.     Patient Active Problem List   Diagnosis Date Noted  . Primary osteoarthritis of right hand 09/05/2017  . Blind painful eye 09/04/2016  . Blindness of one eye with low vision in contralateral eye 04/08/2015  . Bradycardia 04/08/2015  . Central retinal vein occlusion 04/08/2015  . Essential (primary) hypertension 04/08/2015  . Gastro-esophageal reflux disease without esophagitis 04/08/2015  . Glaucoma 04/08/2015  . History of hearing problem 04/08/2015  . Hypercholesterolemia 04/08/2015  . History of colon cancer in adulthood 04/08/2015    No Known Allergies  Past Surgical History:  Procedure Laterality Date  . APPENDECTOMY    . CATARACT EXTRACTION W/ INTRAOCULAR LENS IMPLANT Right 06/17/2015  . CERVIX LESION DESTRUCTION    . ESOPHAGOGASTRODUODENOSCOPY  2004   HH  . EYE SURGERY  11/18/2018   left eye removed due to glaucoma  . PARTIAL COLECTOMY  2004   colon cancer    Social History   Tobacco Use  . Smoking status: Never Smoker  . Smokeless tobacco: Never  Used  . Tobacco comment: smoking cessation materials not required  Substance Use Topics  . Alcohol use: No    Alcohol/week: 0.0 standard drinks  . Drug use: No     Medication list has been reviewed and updated.  Current Meds  Medication Sig  . brimonidine-timolol (COMBIGAN) 0.2-0.5 % ophthalmic solution Apply to  eye.  . losartan (COZAAR) 25 MG tablet Take 1 tablet by mouth once daily  . tobramycin-dexamethasone (TOBRADEX) ophthalmic solution INSTILL 1 DROP INTO LEFT EYE 4 TIMES DAILY FOR 21 DAYS    PHQ 2/9 Scores 01/06/2019 12/11/2018 08/30/2017 09/04/2016  PHQ - 2 Score 0 0 0 0  PHQ- 9 Score - - 0 -    BP Readings from Last 3 Encounters:  01/06/19 116/68  12/11/18 92/62  11/20/17 102/76    Physical Exam Vitals signs and nursing note reviewed.  Constitutional:      General: She is not in acute distress.    Appearance: She is well-developed.  HENT:     Head: Normocephalic and atraumatic.     Right Ear: Tympanic membrane and ear canal normal.     Left Ear: Tympanic membrane and ear canal normal.     Nose:     Right Sinus: No maxillary sinus tenderness.     Left Sinus: No maxillary sinus tenderness.     Mouth/Throat:     Pharynx: Uvula midline.  Eyes:     General: No scleral icterus.       Right eye: No discharge.     Conjunctiva/sclera: Conjunctivae normal.     Comments: Left eye surgically absent  Neck:     Musculoskeletal: Normal range of motion. No erythema.     Thyroid: No thyromegaly.     Vascular: No carotid bruit.  Cardiovascular:     Rate and Rhythm: Normal rate and regular rhythm.     Pulses: Normal pulses.     Heart sounds: Normal heart sounds.  Pulmonary:     Effort: Pulmonary effort is normal. No respiratory distress.     Breath sounds: No wheezing.  Chest:     Breasts:        Right: No mass, nipple discharge, skin change or tenderness.        Left: No mass, nipple discharge, skin change or tenderness.  Abdominal:     General: Bowel sounds are normal.     Palpations: Abdomen is soft.     Tenderness: There is no abdominal tenderness.  Musculoskeletal: Normal range of motion.  Lymphadenopathy:     Cervical: No cervical adenopathy.  Skin:    General: Skin is warm and dry.     Findings: No rash.  Neurological:     Mental Status: She is alert and oriented to  person, place, and time.     Cranial Nerves: No cranial nerve deficit.     Sensory: No sensory deficit.     Deep Tendon Reflexes: Reflexes are normal and symmetric.  Psychiatric:        Speech: Speech normal.        Behavior: Behavior normal.        Thought Content: Thought content normal.     Wt Readings from Last 3 Encounters:  01/06/19 95 lb 3.2 oz (43.2 kg)  12/11/18 96 lb 3.2 oz (43.6 kg)  11/20/17 101 lb (45.8 kg)    BP 116/68   Pulse 61   Ht 4\' 11"  (1.499 m)   Wt 95 lb 3.2 oz (43.2 kg)  SpO2 98%   BMI 19.23 kg/m   Assessment and Plan: 1. Essential (primary) hypertension controlled - CBC with Differential/Platelet - Comprehensive metabolic panel - POCT urinalysis dipstick - losartan (COZAAR) 25 MG tablet; Take 1 tablet (25 mg total) by mouth daily.  Dispense: 90 tablet; Refill: 3  2. Gastro-esophageal reflux disease without esophagitis Stable with minimal sx  3. Blindness of one eye with low vision in contralateral eye  4. Hypercholesterolemia On no medications - Lipid panel  5. Abnormal weight loss Pt encouraged to eat one serving of pure protein daily She will not try Boost or Ensure Recommend weight weekly at home and follow up if continually losing Recheck in 3 months - TSH + free T4  6. Bacteria in urine - sulfamethoxazole-trimethoprim (BACTRIM DS) 800-160 MG tablet; Take 1 tablet by mouth 2 (two) times daily for 5 days.  Dispense: 10 tablet; Refill: 0   Partially dictated using Editor, commissioning. Any errors are unintentional.  Halina Maidens, MD [facility]  01/06/2019

## 2019-01-06 NOTE — Telephone Encounter (Signed)
Urinalysis showed large number of bacteria.  I prescribed a 5 day course of Bactrim to her pharmacy.

## 2019-01-06 NOTE — Patient Instructions (Signed)
Protein Content in Foods  Generally, most healthy people need around 50 grams of protein each day. Depending on your overall health, you may need more or less protein in your diet. Talk to your health care provider or dietitian about how much protein you need. See the following list for the protein content of some common foods. High-protein foods High-protein foods contain 4 grams (4 g) or more of protein per serving. They include:  Beef, ground sirloin (cooked) - 3 oz have 24 g of protein.  Cheese (hard) - 1 oz has 7 g of protein.  Chicken breast, boneless and skinless (cooked) - 3 oz have 13.4 g of protein.  Cottage cheese - 1/2 cup has 13.4 g of protein.  Egg - 1 egg has 6 g of protein.  Fish, filet (cooked) - 1 oz has 6-7 g of protein.  Garbanzo beans (canned or cooked) - 1/2 cup has 6-7 g of protein.  Kidney beans (canned or cooked) - 1/2 cup has 6-7 g of protein.  Lamb (cooked) - 3 oz has 24 g of protein.  Milk - 1 cup (8 oz) has 8 g of protein.  Nuts (peanuts, pistachios, almonds) - 1 oz has 6 g of protein.  Peanut butter - 1 oz has 7-8 g of protein.  Pork tenderloin (cooked) - 3 oz has 18.4 g of protein.  Pumpkin seeds - 1 oz has 8.5 g of protein.  Soybeans (roasted) - 1 oz has 8 g of protein.  Soybeans (cooked) - 1/2 cup has 11 g of protein.  Soy milk - 1 cup (8 oz) has 5-10 g of protein.  Soy or vegetable patty - 1 patty has 11 g of protein.  Sunflower seeds - 1 oz has 5.5 g of protein.  Tofu (firm) - 1/2 cup has 20 g of protein.  Tuna (canned in water) - 3 oz has 20 g of protein.  Yogurt - 6 oz has 8 g of protein. Low-protein foods Low-protein foods contain 3 grams (3 g) or less of protein per serving. They include:  Beets (raw or cooked) - 1/2 cup has 1.5 g of protein.  Bran cereal - 1/2 cup has 2-3 g of protein.  Bread - 1 slice has 2.5 g of protein.  Broccoli (raw or cooked) - 1/2 cup has 2 g of protein.  Collard greens (raw or cooked) - 1/2  cup has 2 g of protein.  Corn (fresh or cooked) - 1/2 cup has 2 g of protein.  Cream cheese - 1 oz has 2 g of protein.  Creamer (half-and-half) - 1 oz has 1 g of protein.  Flour tortilla - 1 tortilla has 2.5 g of protein  Frozen yogurt - 1/2 cup has 3 g of protein.  Fruit or vegetable juice - 1/2 cup has 1 g of protein.  Green beans (raw or cooked) - 1/2 cup has 1 g of protein.  Green peas (canned) - 1/2 cup has 3.5 g of protein.  Muffins - 1 small muffin (2 oz) has 3 g of protein.  Oatmeal (cooked) - 1/2 cup has 3 g of protein.  Potato (baked with skin) - 1 medium potato has 3 g of protein.  Rice (cooked) - 1/2 cup has 2.5-3.5 g of protein.  Sour cream - 1/2 cup has 2.5 g of protein.  Spinach (cooked) - 1/2 cup has 3 g of protein.  Squash (cooked) - 1/2 cup has 1.5 g of protein. Actual amounts of protein may  be different depending on processing. Talk with your health care provider or dietitian about what foods are recommended for you. This information is not intended to replace advice given to you by your health care provider. Make sure you discuss any questions you have with your health care provider. Document Released: 08/28/2015 Document Revised: 02/07/2016 Document Reviewed: 02/07/2016 Elsevier Patient Education  2020 [employer].

## 2019-01-07 LAB — COMPREHENSIVE METABOLIC PANEL
ALT: 8 IU/L (ref 0–32)
AST: 16 IU/L (ref 0–40)
Albumin/Globulin Ratio: 1.8 (ref 1.2–2.2)
Albumin: 4.4 g/dL (ref 3.6–4.6)
Alkaline Phosphatase: 110 IU/L (ref 39–117)
BUN/Creatinine Ratio: 19 (ref 12–28)
BUN: 15 mg/dL (ref 8–27)
Bilirubin Total: 0.5 mg/dL (ref 0.0–1.2)
CO2: 23 mmol/L (ref 20–29)
Calcium: 9.1 mg/dL (ref 8.7–10.3)
Chloride: 102 mmol/L (ref 96–106)
Creatinine, Ser: 0.77 mg/dL (ref 0.57–1.00)
GFR calc Af Amer: 83 mL/min/{1.73_m2} (ref >59)
GFR calc non Af Amer: 72 mL/min/{1.73_m2} (ref >59)
Globulin, Total: 2.5 g/dL (ref 1.5–4.5)
Glucose: 99 mg/dL (ref 65–99)
Potassium: 4.2 mmol/L (ref 3.5–5.2)
Sodium: 141 mmol/L (ref 134–144)
Total Protein: 6.9 g/dL (ref 6.0–8.5)

## 2019-01-07 LAB — CBC WITH DIFFERENTIAL/PLATELET
Basophils Absolute: 0 10*3/uL (ref 0.0–0.2)
Basos: 0 %
EOS (ABSOLUTE): 0.2 10*3/uL (ref 0.0–0.4)
Eos: 3 %
Hematocrit: 36.7 % (ref 34.0–46.6)
Hemoglobin: 12.3 g/dL (ref 11.1–15.9)
Immature Grans (Abs): 0 10*3/uL (ref 0.0–0.1)
Immature Granulocytes: 0 %
Lymphocytes Absolute: 1.5 10*3/uL (ref 0.7–3.1)
Lymphs: 23 %
MCH: 30.4 pg (ref 26.6–33.0)
MCHC: 33.5 g/dL (ref 31.5–35.7)
MCV: 91 fL (ref 79–97)
Monocytes Absolute: 0.6 10*3/uL (ref 0.1–0.9)
Monocytes: 8 %
Neutrophils Absolute: 4.4 10*3/uL (ref 1.4–7.0)
Neutrophils: 66 %
Platelets: 206 10*3/uL (ref 150–450)
RBC: 4.04 x10E6/uL (ref 3.77–5.28)
RDW: 12.5 % (ref 11.7–15.4)
WBC: 6.8 10*3/uL (ref 3.4–10.8)

## 2019-01-07 LAB — TSH+FREE T4
Free T4: 1.11 ng/dL (ref 0.82–1.77)
TSH: 4.3 u[IU]/mL (ref 0.450–4.500)

## 2019-01-07 LAB — LIPID PANEL
Chol/HDL Ratio: 2.8 ratio (ref 0.0–4.4)
Cholesterol, Total: 211 mg/dL — ABNORMAL HIGH (ref 100–199)
HDL: 76 mg/dL (ref >39)
LDL Calculated: 118 mg/dL — ABNORMAL HIGH (ref 0–99)
Triglycerides: 84 mg/dL (ref 0–149)
VLDL Cholesterol Cal: 17 mg/dL (ref 5–40)

## 2019-04-10 ENCOUNTER — Encounter: Payer: Self-pay | Admitting: Internal Medicine

## 2019-04-10 ENCOUNTER — Other Ambulatory Visit: Payer: Self-pay

## 2019-04-10 ENCOUNTER — Ambulatory Visit (INDEPENDENT_AMBULATORY_CARE_PROVIDER_SITE_OTHER): Payer: Medicare Other | Admitting: Internal Medicine

## 2019-04-10 VITALS — BP 118/58 | HR 62 | Ht 59.0 in | Wt 96.0 lb

## 2019-04-10 DIAGNOSIS — I10 Essential (primary) hypertension: Secondary | ICD-10-CM | POA: Diagnosis not present

## 2019-04-10 DIAGNOSIS — Z9001 Acquired absence of eye: Secondary | ICD-10-CM | POA: Diagnosis not present

## 2019-04-10 DIAGNOSIS — Z85038 Personal history of other malignant neoplasm of large intestine: Secondary | ICD-10-CM | POA: Diagnosis not present

## 2019-04-10 NOTE — Progress Notes (Signed)
Date:  04/10/2019   Name:  Tammy Mcgee   DOB:  06-08-1933   MRN:  [id number]   Chief Complaint: Hypertension  Hypertension This is a chronic problem. The problem is controlled. Pertinent negatives include no chest pain, headaches, palpitations or shortness of breath. Past treatments include angiotensin blockers. The current treatment provides significant improvement. There are no compliance problems.   s/p Eye surgery - she reports feeling very well.  No more pain. Weight loss - she reports eating well, trying to increase protein.  She thinks that her weight loss was due to a UTI that now is resolved. She continues to decline all immunizations and colonoscopy.  Lab Results  Component Value Date   TSH 4.300 01/06/2019   T3TOTAL 171 09/04/2016   T4TOTAL 7.2 09/04/2016   Lab Results  Component Value Date   CREATININE 0.77 01/06/2019   BUN 15 01/06/2019   NA 141 01/06/2019   K 4.2 01/06/2019   CL 102 01/06/2019   CO2 23 01/06/2019   Lab Results  Component Value Date   WBC 6.8 01/06/2019   HGB 12.3 01/06/2019   HCT 36.7 01/06/2019   MCV 91 01/06/2019   PLT 206 01/06/2019     Review of Systems  Constitutional: Negative for chills, fatigue and fever.  Respiratory: Negative for chest tightness, shortness of breath and wheezing.   Cardiovascular: Negative for chest pain, palpitations and leg swelling.  Gastrointestinal: Negative for abdominal pain, blood in stool, constipation, diarrhea and nausea.  Genitourinary: Negative for dysuria, frequency and urgency.  Musculoskeletal: Negative for arthralgias and gait problem.  Allergic/Immunologic: Negative for environmental allergies.  Neurological: Negative for dizziness and headaches.  Psychiatric/Behavioral: Negative for sleep disturbance. The patient is not nervous/anxious.     Patient Active Problem List   Diagnosis Date Noted  . Primary osteoarthritis of right hand 09/05/2017  . Blind painful eye 09/04/2016  .  Blindness of one eye with low vision in contralateral eye 04/08/2015  . Bradycardia 04/08/2015  . Central retinal vein occlusion 04/08/2015  . Essential (primary) hypertension 04/08/2015  . Gastro-esophageal reflux disease without esophagitis 04/08/2015  . Glaucoma 04/08/2015  . History of hearing problem 04/08/2015  . Hypercholesterolemia 04/08/2015  . History of colon cancer in adulthood 04/08/2015    No Known Allergies  Past Surgical History:  Procedure Laterality Date  . APPENDECTOMY    . CATARACT EXTRACTION W/ INTRAOCULAR LENS IMPLANT Right 06/17/2015  . CERVIX LESION DESTRUCTION    . ESOPHAGOGASTRODUODENOSCOPY  2004   HH  . EYE SURGERY  11/18/2018   left eye removed due to glaucoma  . PARTIAL COLECTOMY  2004   colon cancer    Social History   Tobacco Use  . Smoking status: Never Smoker  . Smokeless tobacco: Never Used  . Tobacco comment: smoking cessation materials not required  Substance Use Topics  . Alcohol use: No    Alcohol/week: 0.0 standard drinks  . Drug use: No     Medication list has been reviewed and updated.  Current Meds  Medication Sig  . brimonidine-timolol (COMBIGAN) 0.2-0.5 % ophthalmic solution Apply to eye.  . losartan (COZAAR) 25 MG tablet Take 1 tablet (25 mg total) by mouth daily. (Patient taking differently: Take 25 mg by mouth every other day. )  . tobramycin-dexamethasone (TOBRADEX) ophthalmic solution INSTILL 1 DROP INTO LEFT EYE 4 TIMES DAILY FOR 21 DAYS    PHQ 2/9 Scores 04/10/2019 01/06/2019 12/11/2018 08/30/2017  PHQ - 2 Score 0 0 0  0  PHQ- 9 Score - - - 0    BP Readings from Last 3 Encounters:  04/10/19 (!) 118/58  01/06/19 116/68  12/11/18 92/62    Physical Exam Vitals signs and nursing note reviewed.  Constitutional:      General: She is not in acute distress.    Appearance: She is well-developed.  HENT:     Head: Normocephalic and atraumatic.  Eyes:     Comments: Left eye absent surgically  Neck:      Musculoskeletal: Normal range of motion.  Cardiovascular:     Rate and Rhythm: Normal rate and regular rhythm.     Pulses: Normal pulses.     Heart sounds: No murmur.  Pulmonary:     Effort: Pulmonary effort is normal. No respiratory distress.  Musculoskeletal:     Right lower leg: No edema.     Left lower leg: No edema.  Lymphadenopathy:     Cervical: No cervical adenopathy.  Skin:    General: Skin is warm and dry.     Capillary Refill: Capillary refill takes less than 2 seconds.     Findings: No lesion or rash.  Neurological:     General: No focal deficit present.     Mental Status: She is alert and oriented to person, place, and time.  Psychiatric:        Behavior: Behavior normal.        Thought Content: Thought content normal.     Wt Readings from Last 3 Encounters:  04/10/19 96 lb (43.5 kg)  01/06/19 95 lb 3.2 oz (43.2 kg)  12/11/18 96 lb 3.2 oz (43.6 kg)    BP (!) 118/58   Pulse 62   Ht 4\' 11"  (1.499 m)   Wt 96 lb (43.5 kg)   SpO2 97%   BMI 19.39 kg/m   Assessment and Plan: 1. Essential (primary) hypertension Clinically stable exam with well controlled BP.   Tolerating medications, losartan 25 mg every other day, without side effects at this time. Pt to continue current regimen and low sodium diet; benefits of regular exercise as able discussed.  2. Acquired absence of one eye Pt is adjusting well with no further pain or issues  3. History of colon cancer in adulthood Colectomy in 2004 - pt declines further testing   Partially dictated using Editor, commissioning. Any errors are unintentional.  Halina Maidens, MD [facility]  04/10/2019

## 2019-05-27 DIAGNOSIS — L570 Actinic keratosis: Secondary | ICD-10-CM | POA: Diagnosis not present

## 2019-05-27 DIAGNOSIS — L814 Other melanin hyperpigmentation: Secondary | ICD-10-CM | POA: Diagnosis not present

## 2019-05-27 DIAGNOSIS — L820 Inflamed seborrheic keratosis: Secondary | ICD-10-CM | POA: Diagnosis not present

## 2019-05-27 DIAGNOSIS — L821 Other seborrheic keratosis: Secondary | ICD-10-CM | POA: Diagnosis not present

## 2019-06-03 IMAGING — CR DG KNEE 3 VIEWS*L*
3 series · 3 of 3 positions shown · non-contrast
Comparison: None.

CLINICAL DATA: Left knee pain for several weeks

EXAM:
LEFT KNEE - 3 VIEW

[knee ap]
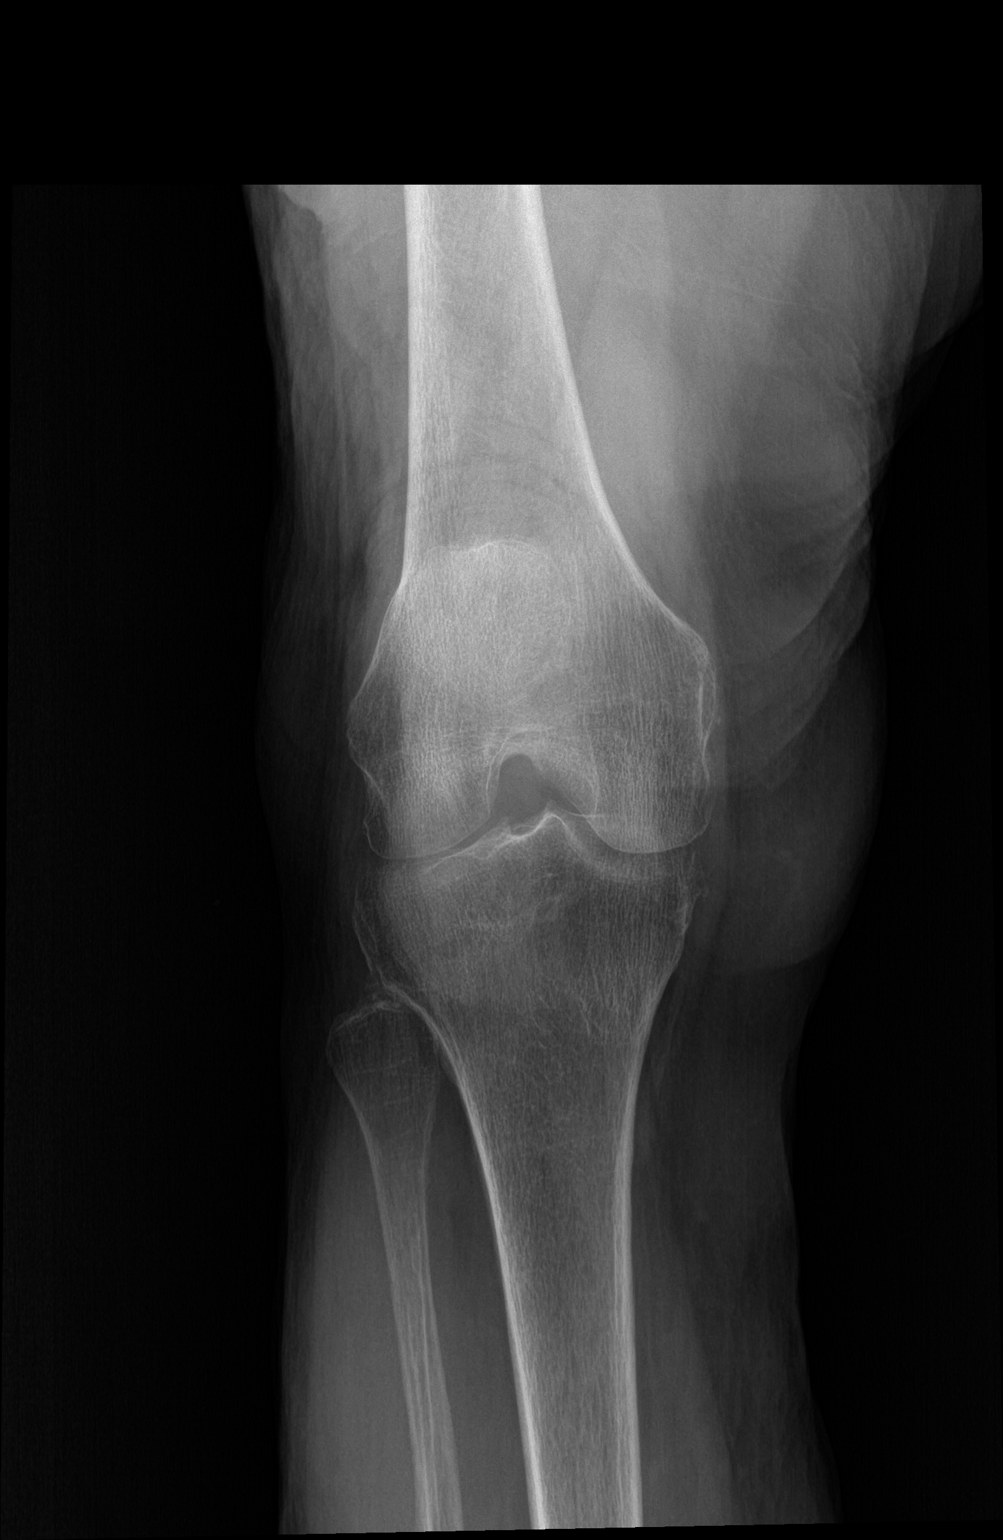

[knee obl]
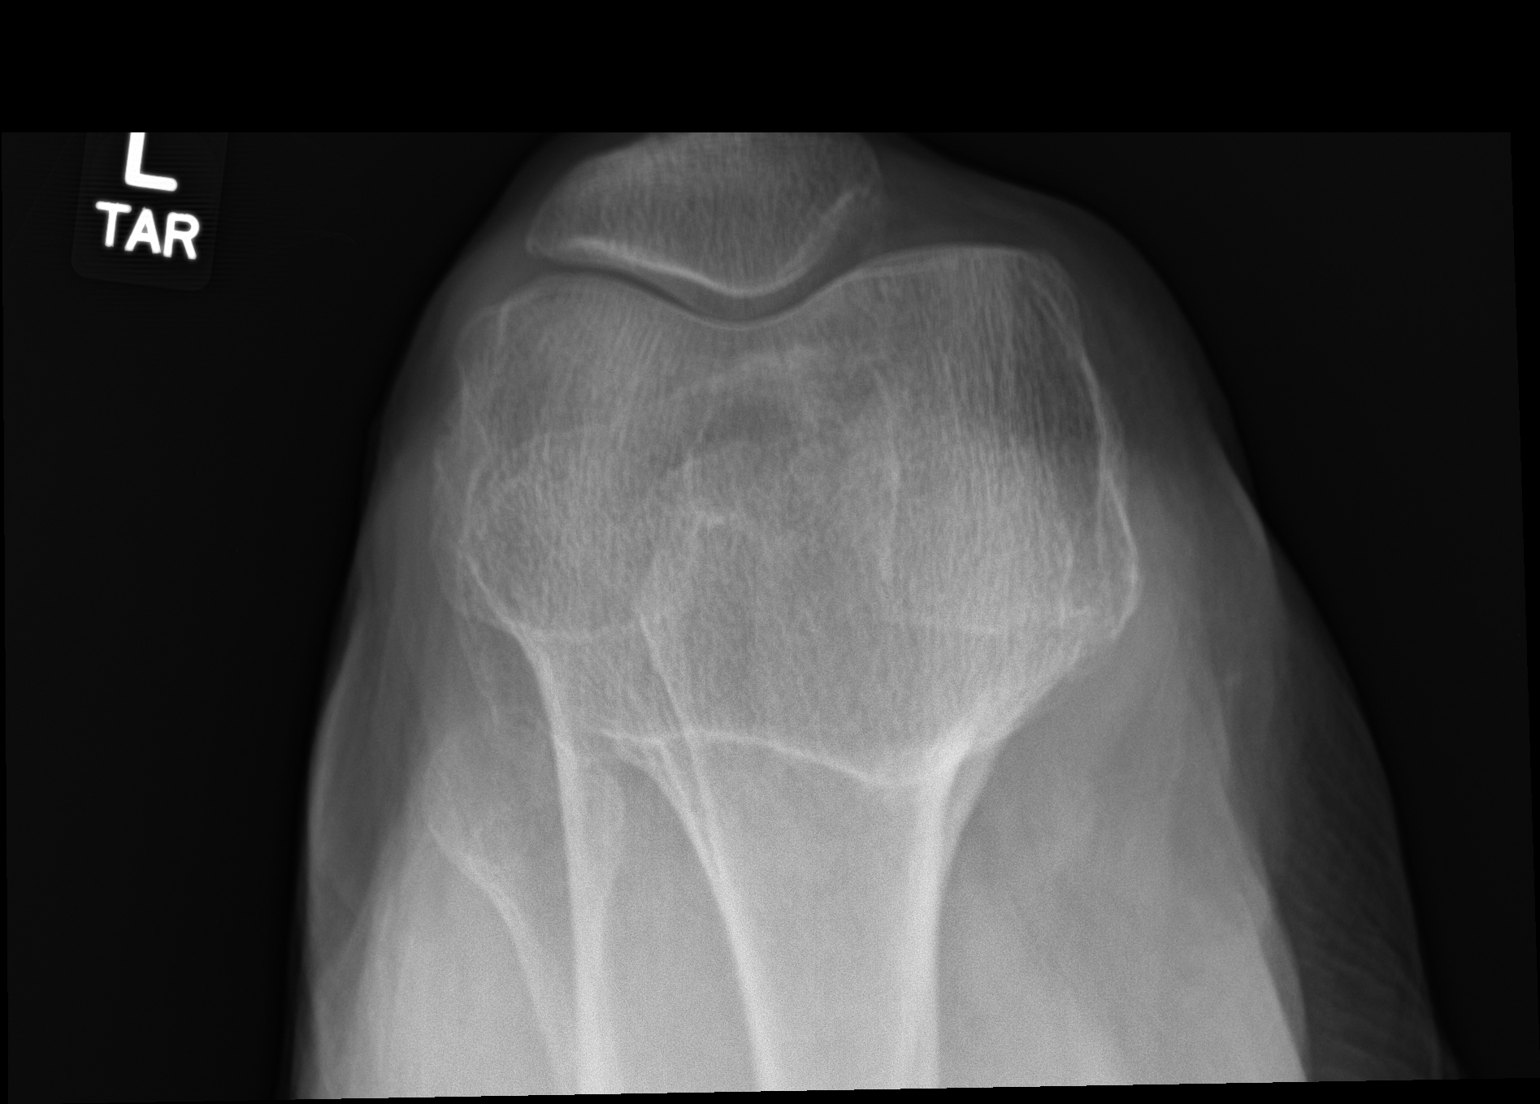

[knee lat]
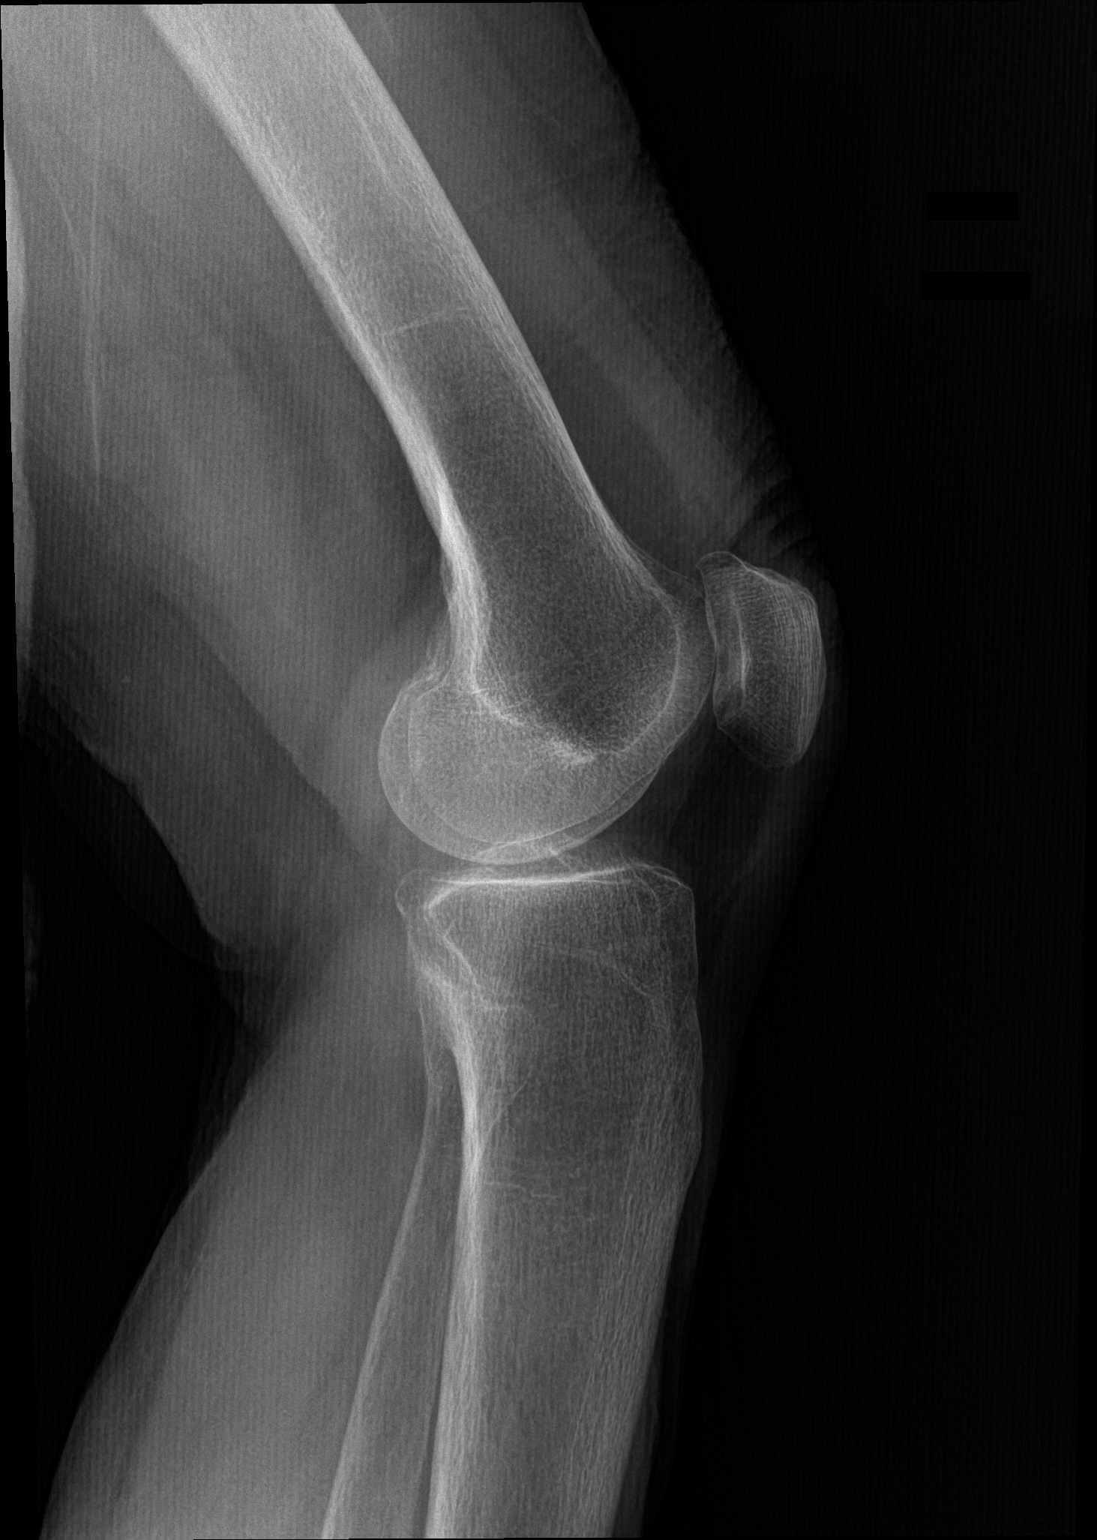

[3 of 3 positions shown; findings below may reference images not displayed]

FINDINGS: No acute fracture or dislocation is noted. Mild patellofemoral
degenerative changes are seen. No joint effusion or other soft
tissue abnormality is noted.
IMPRESSION: Mild degenerative change without acute abnormality.

## 2019-12-17 ENCOUNTER — Ambulatory Visit (INDEPENDENT_AMBULATORY_CARE_PROVIDER_SITE_OTHER): Payer: Medicare Other

## 2019-12-17 ENCOUNTER — Other Ambulatory Visit: Payer: Self-pay

## 2019-12-17 VITALS — BP 120/64 | HR 65 | Resp 16 | Ht 59.0 in | Wt 94.2 lb

## 2019-12-17 DIAGNOSIS — R399 Unspecified symptoms and signs involving the genitourinary system: Secondary | ICD-10-CM | POA: Diagnosis not present

## 2019-12-17 DIAGNOSIS — Z0000 Encounter for general adult medical examination without abnormal findings: Secondary | ICD-10-CM

## 2019-12-17 LAB — POCT URINALYSIS DIPSTICK
Bilirubin, UA: NEGATIVE
Blood, UA: NEGATIVE
Glucose, UA: NEGATIVE
Ketones, UA: NEGATIVE
Leukocytes, UA: NEGATIVE
Nitrite, UA: NEGATIVE
Protein, UA: NEGATIVE
Spec Grav, UA: 1.015 (ref 1.010–1.025)
Urobilinogen, UA: 0.2 E.U./dL
pH, UA: 5 (ref 5.0–8.0)

## 2019-12-17 NOTE — Patient Instructions (Signed)
Tammy Mcgee , Thank you for taking time to come for your Medicare Wellness Visit. I appreciate your ongoing commitment to your health goals. Please review the following plan we discussed and let me know if I can assist you in the future.   Screening recommendations/referrals: Colonoscopy: no longer required Mammogram: no longer required Bone Density: no longer required Recommended yearly ophthalmology/optometry visit for glaucoma screening and checkup Recommended yearly dental visit for hygiene and checkup  Vaccinations: Influenza vaccine: postponed  Pneumococcal vaccine: declined Tdap vaccine: done 09/04/16 Shingles vaccine: Shingrix discussed. Please contact your pharmacy for coverage information.  Covid-19:discussed  Advanced directives: Advance directive discussed with you today. I have provided a copy for you to complete at home and have notarized. Once this is complete please bring a copy in to our office so we can scan it into your chart.  Conditions/risks identified: Recommend drinking 6-8 glasses of water per day  Next appointment: Follow up in one year for your annual wellness visit    Preventive Care 65 Years and Older, Female Preventive care refers to lifestyle choices and visits with your health care provider that can promote health and wellness. What does preventive care include?  A yearly physical exam. This is also called an annual well check.  Dental exams once or twice a year.  Routine eye exams. Ask your health care provider how often you should have your eyes checked.  Personal lifestyle choices, including:  Daily care of your teeth and gums.  Regular physical activity.  Eating a healthy diet.  Avoiding tobacco and drug use.  Limiting alcohol use.  Practicing safe sex.  Taking low-dose aspirin every day.  Taking vitamin and mineral supplements as recommended by your health care provider. What happens during an annual well check? The services and  screenings done by your health care provider during your annual well check will depend on your age, overall health, lifestyle risk factors, and family history of disease. Counseling  Your health care provider may ask you questions about your:  Alcohol use.  Tobacco use.  Drug use.  Emotional well-being.  Home and relationship well-being.  Sexual activity.  Eating habits.  History of falls.  Memory and ability to understand (cognition).  Work and work Statistician.  Reproductive health. Screening  You may have the following tests or measurements:  Height, weight, and BMI.  Blood pressure.  Lipid and cholesterol levels. These may be checked every 5 years, or more frequently if you are over 33 years old.  Skin check.  Lung cancer screening. You may have this screening every year starting at age 44 if you have a 30-pack-year history of smoking and currently smoke or have quit within the past 15 years.  Fecal occult blood test (FOBT) of the stool. You may have this test every year starting at age 70.  Flexible sigmoidoscopy or colonoscopy. You may have a sigmoidoscopy every 5 years or a colonoscopy every 10 years starting at age 36.  Hepatitis C blood test.  Hepatitis B blood test.  Sexually transmitted disease (STD) testing.  Diabetes screening. This is done by checking your blood sugar (glucose) after you have not eaten for a while (fasting). You may have this done every 1-3 years.  Bone density scan. This is done to screen for osteoporosis. You may have this done starting at age 32.  Mammogram. This may be done every 1-2 years. Talk to your health care provider about how often you should have regular mammograms. Talk with your  health care provider about your test results, treatment options, and if necessary, the need for more tests. Vaccines  Your health care provider may recommend certain vaccines, such as:  Influenza vaccine. This is recommended every  year.  Tetanus, diphtheria, and acellular pertussis (Tdap, Td) vaccine. You may need a Td booster every 10 years.  Zoster vaccine. You may need this after age 62.  Pneumococcal 13-valent conjugate (PCV13) vaccine. One dose is recommended after age 55.  Pneumococcal polysaccharide (PPSV23) vaccine. One dose is recommended after age 76. Talk to your health care provider about which screenings and vaccines you need and how often you need them. This information is not intended to replace advice given to you by your health care provider. Make sure you discuss any questions you have with your health care provider. Document Released: 06/25/2015 Document Revised: 02/16/2016 Document Reviewed: 03/30/2015 Elsevier Interactive Patient Education  2017 [place] Prevention in the Home Falls can cause injuries. They can happen to people of all ages. There are many things you can do to make your home safe and to help prevent falls. What can I do on the outside of my home?  Regularly fix the edges of walkways and driveways and fix any cracks.  Remove anything that might make you trip as you walk through a door, such as a raised step or threshold.  Trim any bushes or trees on the path to your home.  Use bright outdoor lighting.  Clear any walking paths of anything that might make someone trip, such as rocks or tools.  Regularly check to see if handrails are loose or broken. Make sure that both sides of any steps have handrails.  Any raised decks and porches should have guardrails on the edges.  Have any leaves, snow, or ice cleared regularly.  Use sand or salt on walking paths during winter.  Clean up any spills in your garage right away. This includes oil or grease spills. What can I do in the bathroom?  Use night lights.  Install grab bars by the toilet and in the tub and shower. Do not use towel bars as grab bars.  Use non-skid mats or decals in the tub or shower.  If you  need to sit down in the shower, use a plastic, non-slip stool.  Keep the floor dry. Clean up any water that spills on the floor as soon as it happens.  Remove soap buildup in the tub or shower regularly.  Attach bath mats securely with double-sided non-slip rug tape.  Do not have throw rugs and other things on the floor that can make you trip. What can I do in the bedroom?  Use night lights.  Make sure that you have a light by your bed that is easy to reach.  Do not use any sheets or blankets that are too big for your bed. They should not hang down onto the floor.  Have a firm chair that has side arms. You can use this for support while you get dressed.  Do not have throw rugs and other things on the floor that can make you trip. What can I do in the kitchen?  Clean up any spills right away.  Avoid walking on wet floors.  Keep items that you use a lot in easy-to-reach places.  If you need to reach something above you, use a strong step stool that has a grab bar.  Keep electrical cords out of the way.  Do not use  floor polish or wax that makes floors slippery. If you must use wax, use non-skid floor wax.  Do not have throw rugs and other things on the floor that can make you trip. What can I do with my stairs?  Do not leave any items on the stairs.  Make sure that there are handrails on both sides of the stairs and use them. Fix handrails that are broken or loose. Make sure that handrails are as long as the stairways.  Check any carpeting to make sure that it is firmly attached to the stairs. Fix any carpet that is loose or worn.  Avoid having throw rugs at the top or bottom of the stairs. If you do have throw rugs, attach them to the floor with carpet tape.  Make sure that you have a light switch at the top of the stairs and the bottom of the stairs. If you do not have them, ask someone to add them for you. What else can I do to help prevent falls?  Wear shoes  that:  Do not have high heels.  Have rubber bottoms.  Are comfortable and fit you well.  Are closed at the toe. Do not wear sandals.  If you use a stepladder:  Make sure that it is fully opened. Do not climb a closed stepladder.  Make sure that both sides of the stepladder are locked into place.  Ask someone to hold it for you, if possible.  Clearly mark and make sure that you can see:  Any grab bars or handrails.  First and last steps.  Where the edge of each step is.  Use tools that help you move around (mobility aids) if they are needed. These include:  Canes.  Walkers.  Scooters.  Crutches.  Turn on the lights when you go into a dark area. Replace any light bulbs as soon as they burn out.  Set up your furniture so you have a clear path. Avoid moving your furniture around.  If any of your floors are uneven, fix them.  If there are any pets around you, be aware of where they are.  Review your medicines with your doctor. Some medicines can make you feel dizzy. This can increase your chance of falling. Ask your doctor what other things that you can do to help prevent falls. This information is not intended to replace advice given to you by your health care provider. Make sure you discuss any questions you have with your health care provider. Document Released: 03/25/2009 Document Revised: 11/04/2015 Document Reviewed: 07/03/2014 Elsevier Interactive Patient Education  2017 [employer].

## 2019-12-17 NOTE — Progress Notes (Signed)
Subjective:   Tammy Mcgee is a 86 y.o. female who presents for Medicare Annual (Subsequent) preventive examination.  Review of Systems     Cardiac Risk Factors include: advanced age (>55men, >32 women);hypertension     Objective:    Today's Vitals   12/17/19 1127  BP: 120/64  Pulse: 65  Resp: 16  SpO2: 99%  Weight: 94 lb 3.2 oz (42.7 kg)  Height: 4\' 11"  (1.499 m)   Body mass index is 19.03 kg/m.  Advanced Directives 12/17/2019 12/11/2018 08/30/2017 09/04/2016 09/03/2015  Does Patient Have a Medical Advance Directive? No No No No No  Would patient like information on creating a medical advance directive? Yes (MAU/Ambulatory/Procedural Areas - Information given) No - Patient declined Yes (MAU/Ambulatory/Procedural Areas - Information given) - No - patient declined information    Current Medications (verified) Outpatient Encounter Medications as of 12/17/2019  Medication Sig  . brimonidine-timolol (COMBIGAN) 0.2-0.5 % ophthalmic solution Apply to eye.  . losartan (COZAAR) 25 MG tablet Take 1 tablet (25 mg total) by mouth daily. (Patient taking differently: Take 25 mg by mouth every other day. )  . [DISCONTINUED] tobramycin-dexamethasone (TOBRADEX) ophthalmic solution INSTILL 1 DROP INTO LEFT EYE 4 TIMES DAILY FOR 21 DAYS   No facility-administered encounter medications on file as of 12/17/2019.    Allergies (verified) Patient has no known allergies.   History: Past Medical History:  Diagnosis Date  . Blind painful eye 09/04/2016   Added automatically from request for surgery [id number]  . Blindness of one eye with low vision in contralateral eye 04/08/2015  . GERD (gastroesophageal reflux disease)   . Glaucoma   . Hyperlipidemia   . Hypertension   . Legally blind in left eye, as defined in [place]    Past Surgical History:  Procedure Laterality Date  . APPENDECTOMY    . CATARACT EXTRACTION W/ INTRAOCULAR LENS IMPLANT Right 06/17/2015  . CERVIX LESION DESTRUCTION    .  ESOPHAGOGASTRODUODENOSCOPY  2004   [facility]  . EYE SURGERY  11/18/2018   left eye removed due to glaucoma  . PARTIAL COLECTOMY  2004   colon cancer   Family History  Problem Relation Age of Onset  . Hypertension Mother   . CAD Father   . Heart attack Father   . Stroke Sister    Social History   Socioeconomic History  . Marital status: Divorced    Spouse name: Not on file  . Number of children: 4  . Years of education: GED  . Highest education level: 8th grade  Occupational History  . Occupation: Retired  Tobacco Use  . Smoking status: Never Smoker  . Smokeless tobacco: Never Used  . Tobacco comment: smoking cessation materials not required  Vaping Use  . Vaping Use: Never used  Substance and Sexual Activity  . Alcohol use: No    Alcohol/week: 0.0 standard drinks  . Drug use: No  . Sexual activity: Not Currently  Other Topics Concern  . Not on file  Social History Narrative   Pt lives with her daughter; still drives but unsure how long she will continue 12/17/19   Social Determinants of Health   Financial Resource Strain: Low Risk   . Difficulty of Paying Living Expenses: Not hard at all  Food Insecurity: No Food Insecurity  . Worried About Charity fundraiser in the Last Year: Never true  . Ran Out of Food in the Last Year: Never true  Transportation Needs: No Transportation Needs  . Lack  of Transportation (Medical): No  . Lack of Transportation (Non-Medical): No  Physical Activity: Inactive  . Days of Exercise per Week: 0 days  . Minutes of Exercise per Session: 0 min  Stress: No Stress Concern Present  . Feeling of Stress : Not at all  Social Connections: Socially Isolated  . Frequency of Communication with Friends and Family: More than three times a week  . Frequency of Social Gatherings with Friends and Family: More than three times a week  . Attends Religious Services: Never  . Active Member of Clubs or Organizations: No  . Attends Archivist  Meetings: Never  . Marital Status: Divorced    Tobacco Counseling Counseling given: Not Answered Comment: smoking cessation materials not required   Clinical Intake:  Pre-visit preparation completed: Yes  Pain : No/denies pain     BMI - recorded: 19.03 Nutritional Status: BMI of 19-24  Normal Nutritional Risks: None Diabetes: No  How often do you need to have someone help you when you read instructions, pamphlets, or other written materials from your doctor or pharmacy?: 1 - Never    Interpreter Needed?: No  Information entered by :: Tammy Marker LPN   Activities of Daily Living In your present state of health, do you have any difficulty performing the following activities: 12/17/2019  Hearing? Y  Comment does not wear her hearing aids  Vision? Y  Difficulty concentrating or making decisions? Y  Walking or climbing stairs? N  Dressing or bathing? N  Doing errands, shopping? N  Preparing Food and eating ? N  Using the Toilet? N  In the past six months, have you accidently leaked urine? Y  Do you have problems with loss of bowel control? N  Managing your Medications? N  Managing your Finances? N  Housekeeping or managing your Housekeeping? N  Some recent data might be hidden    Patient Care Team: Tammy Hess, MD as PCP - General (Internal Medicine)  Indicate any recent Medical Services you may have received from other than [facility] providers in the past year (date may be approximate).     Assessment:   This is a routine wellness examination for Tammy Mcgee.  Hearing/Vision screen  Hearing Screening   125Hz  250Hz  500Hz  1000Hz  2000Hz  3000Hz  4000Hz  6000Hz  8000Hz   Right ear:           Left ear:           Comments: Pt c/o poor hearing but does not wear her hearing aids bc she says they do not help  Vision Screening Comments: Annual vision screenings done at [facility]; left eye removed 11/2018  Dietary issues and exercise activities discussed: Current Exercise Habits:  The patient does not participate in regular exercise at present, Exercise limited by: None identified  Goals    . DIET - INCREASE WATER INTAKE     Recommend to drink at least 6-8 8oz glasses of water per day.      Depression Screen PHQ 2/9 Scores 12/17/2019 04/10/2019 01/06/2019 12/11/2018 08/30/2017 09/04/2016 09/03/2015  PHQ - 2 Score 0 0 0 0 0 0 0  PHQ- 9 Score - - - - 0 - -    Fall Risk Fall Risk  12/17/2019 01/06/2019 12/11/2018 08/30/2017 09/04/2016  Falls in the past year? 1 1 1  Yes Yes  Comment - - - fell after cutting logs. States she tripped over log and fell on to ground -  Number falls in past yr: 1 1 1 2  or more  2 or more  Injury with Fall? 0 1 0 Yes No  Risk Factor Category  - - - High Fall Risk High Fall Risk  Risk for fall due to : History of fall(s) History of fall(s);Impaired vision Impaired vision Impaired balance/gait;History of fall(s);Impaired vision -  Risk for fall due to: Comment - - - staggering gait; blind left eye -  Follow up Falls prevention discussed - Falls prevention discussed Falls evaluation completed;Education provided;Falls prevention discussed -    Any stairs in or around the home? Yes  If so, are there any without handrails? Yes  Home free of loose throw rugs in walkways, pet beds, electrical cords, etc? Yes  Adequate lighting in your home to reduce risk of falls? Yes   ASSISTIVE DEVICES UTILIZED TO PREVENT FALLS:  Life alert? No  Use of a cane, walker or w/c? No  Grab bars in the bathroom? No  Shower chair or bench in shower? No  Elevated toilet seat or a handicapped toilet? No   TIMED UP AND GO:  Was the test performed? Yes .  Length of time to ambulate 10 feet: 5 sec.   Gait steady and fast without use of assistive device  Cognitive Function:     6CIT Screen 12/17/2019 12/11/2018 08/30/2017 09/04/2016  What Year? 0 points 0 points 0 points 0 points  What month? 0 points 0 points 0 points 0 points  What time? 0 points 0 points 0 points 0 points    Count back from 20 0 points 0 points 0 points 0 points  Months in reverse 0 points 0 points 0 points 0 points  Repeat phrase 6 points 2 points 8 points 0 points  Total Score 6 2 8  0    Immunizations Immunization History  Administered Date(s) Administered  . Influenza-Unspecified 06/12/2018  . Tdap 09/04/2016    TDAP status: Up to date   Flu Vaccine status: Declined, Education has been provided regarding the importance of this vaccine but patient still declined. Advised may receive this vaccine at local pharmacy or Health Dept. Aware to provide a copy of the vaccination record if obtained from local pharmacy or Health Dept. Verbalized acceptance and understanding.   Pneumococcal vaccine status: Declined,  Education has been provided regarding the importance of this vaccine but patient still declined. Advised may receive this vaccine at local pharmacy or Health Dept. Aware to provide a copy of the vaccination record if obtained from local pharmacy or Health Dept. Verbalized acceptance and understanding.    Covid-19 vaccine status: Declined, Education has been provided regarding the importance of this vaccine but patient still declined. Advised may receive this vaccine at local pharmacy or Health Dept.or vaccine clinic. Aware to provide a copy of the vaccination record if obtained from local pharmacy or Health Dept. Verbalized acceptance and understanding.  Qualifies for Shingles Vaccine? Yes   Zostavax completed No   Shingrix Completed?: No.    Education has been provided regarding the importance of this vaccine. Patient has been advised to call insurance company to determine out of pocket expense if they have not yet received this vaccine. Advised may also receive vaccine at local pharmacy or Health Dept. Verbalized acceptance and understanding.  Screening Tests Health Maintenance  Topic Date Due  . COVID-19 Vaccine (1) Never done  . PNA vac Low Risk Adult (1 of 2 - PCV13) 09/02/2020  (Originally 05/25/2000)  . INFLUENZA VACCINE  01/11/2020  . TETANUS/TDAP  09/05/2026  . DEXA SCAN  Discontinued  Health Maintenance  Health Maintenance Due  Topic Date Due  . COVID-19 Vaccine (1) Never done    Colorectal cancer screening: No longer required.    Mammogram status: No longer required.    Bone Density screening - not completed; pt declines  Lung Cancer Screening: (Low Dose CT Chest recommended if Age 98-80 years, 30 pack-year currently smoking OR have quit w/in 15years.) does not qualify.   Additional Screening:  Hepatitis C Screening: no longer required  Vision Screening: Recommended annual ophthalmology exams for early detection of glaucoma and other disorders of the eye. Is the patient up to date with their annual eye exam?  Yes  Who is the provider or what is the name of the office in which the patient attends annual eye exams? [facility]  Dental Screening: Recommended annual dental exams for proper oral hygiene  Community Resource Referral / Chronic Care Management: CRR required this visit?  No   CCM required this visit?  No      Plan:     I have personally reviewed and noted the following in the patient's chart:   . Medical and social history . Use of alcohol, tobacco or illicit drugs  . Current medications and supplements . Functional ability and status . Nutritional status . Physical activity . Advanced directives . List of other physicians . Hospitalizations, surgeries, and ER visits in previous 12 months . Vitals . Screenings to include cognitive, depression, and falls . Referrals and appointments  In addition, I have reviewed and discussed with patient certain preventive protocols, quality metrics, and best practice recommendations. A written personalized care plan for preventive services as well as general preventive health recommendations were provided to patient.     Tammy Marker, LPN   06/14/7515   Nurse Notes: pt c/o feeling tired,  unsteady and dizzy at time with frequent falls although no injuries. Pt states she hasn't felt right since having Covid in Dec 2020 and has felt weak.   Pt also c/o possible UTI with sxs occurring over the last month including lower back pain, occasional feeling of unable to empty bladder, urinary frequency and dark color to urine. Per Dr. Army Melia pt added to nurse scheduled for UA.

## 2020-01-07 NOTE — Progress Notes (Signed)
Date:  01/08/2020   Name:  Tammy Mcgee   DOB:  02/06/1933   MRN:  [id number]   Chief Complaint: Hypertension (Medicare yearly. Declined Breast Exam. No pap- aged out. )  Tammy Mcgee is a 86 y.o. female who presents today for her Complete Annual Exam. She feels fairly well. She reports exercising - home work outs at bedtime. She reports she is sleeping poorly. Breast complaints - none; declined breast exam.  Mammogram: aged out DEXA: declined Colonoscopy: aged out and declines  Immunization History  Administered Date(s) Administered   Influenza-Unspecified 06/12/2018   Tdap 09/04/2016    Hypertension This is a chronic problem. The problem is controlled. Pertinent negatives include no chest pain, headaches, palpitations or shortness of breath. Past treatments include angiotensin blockers. The current treatment provides moderate improvement. There is no history of kidney disease, CAD/MI, CVA or PVD.    Lab Results  Component Value Date   CREATININE 0.77 01/06/2019   BUN 15 01/06/2019   NA 141 01/06/2019   K 4.2 01/06/2019   CL 102 01/06/2019   CO2 23 01/06/2019   Lab Results  Component Value Date   CHOL 211 (H) 01/06/2019   HDL 76 01/06/2019   LDLCALC 118 (H) 01/06/2019   TRIG 84 01/06/2019   CHOLHDL 2.8 01/06/2019   Lab Results  Component Value Date   TSH 4.300 01/06/2019   No results found for: HGBA1C Lab Results  Component Value Date   WBC 6.8 01/06/2019   HGB 12.3 01/06/2019   HCT 36.7 01/06/2019   MCV 91 01/06/2019   PLT 206 01/06/2019   Lab Results  Component Value Date   ALT 8 01/06/2019   AST 16 01/06/2019   ALKPHOS 110 01/06/2019   BILITOT 0.5 01/06/2019     Review of Systems  Constitutional: Negative for chills, fatigue and fever.  HENT: Positive for hearing loss. Negative for congestion, tinnitus, trouble swallowing and voice change.   Eyes: Positive for visual disturbance.  Respiratory: Negative for cough, chest tightness,  shortness of breath and wheezing.   Cardiovascular: Negative for chest pain, palpitations and leg swelling.  Gastrointestinal: Negative for abdominal pain, constipation, diarrhea and vomiting.       Intermittent reflux  Endocrine: Negative for polydipsia and polyuria.  Genitourinary: Negative for dysuria, frequency, genital sores, vaginal bleeding and vaginal discharge.  Musculoskeletal: Positive for arthralgias (in hands and thumb). Negative for gait problem and joint swelling.  Skin: Negative for color change and rash.  Neurological: Negative for dizziness, tremors, light-headedness and headaches.  Hematological: Negative for adenopathy. Does not bruise/bleed easily.  Psychiatric/Behavioral: Negative for dysphoric mood and sleep disturbance. The patient is not nervous/anxious.     Patient Active Problem List   Diagnosis Date Noted   Acquired absence of one eye 04/10/2019   Primary osteoarthritis of right hand 09/05/2017   Bradycardia 04/08/2015   Central retinal vein occlusion 04/08/2015   Essential (primary) hypertension 04/08/2015   Gastro-esophageal reflux disease without esophagitis 04/08/2015   Glaucoma 04/08/2015   History of hearing problem 04/08/2015   Hypercholesterolemia 04/08/2015   History of colon cancer in adulthood 04/08/2015    No Known Allergies  Past Surgical History:  Procedure Laterality Date   APPENDECTOMY     CATARACT EXTRACTION W/ INTRAOCULAR LENS IMPLANT Right 06/17/2015   CERVIX LESION DESTRUCTION     ESOPHAGOGASTRODUODENOSCOPY  2004   [facility]   EYE SURGERY  11/18/2018   left eye removed due to glaucoma   PARTIAL  COLECTOMY  2004   colon cancer    Social History   Tobacco Use   Smoking status: Never Smoker   Smokeless tobacco: Never Used   Tobacco comment: smoking cessation materials not required  Vaping Use   Vaping Use: Never used  Substance Use Topics   Alcohol use: No    Alcohol/week: 0.0 standard drinks   Drug use:  No     Medication list has been reviewed and updated.  Current Meds  Medication Sig   brimonidine-timolol (COMBIGAN) 0.2-0.5 % ophthalmic solution Apply to eye.   losartan (COZAAR) 25 MG tablet Take 1 tablet (25 mg total) by mouth daily.   [DISCONTINUED] losartan (COZAAR) 25 MG tablet Take 1 tablet (25 mg total) by mouth daily. (Patient taking differently: Take 25 mg by mouth every other day. )    PHQ 2/9 Scores 12/17/2019 04/10/2019 01/06/2019 12/11/2018  PHQ - 2 Score 0 0 0 0  PHQ- 9 Score - - - -    No flowsheet data found.  BP Readings from Last 3 Encounters:  01/08/20 (!) 130/74  12/17/19 120/64  04/10/19 (!) 118/58    Physical Exam Vitals and nursing note reviewed.  Constitutional:      General: She is not in acute distress.    Appearance: She is well-developed.  HENT:     Head: Normocephalic and atraumatic.     Right Ear: Tympanic membrane and ear canal normal.     Left Ear: Tympanic membrane and ear canal normal.     Nose:     Right Sinus: No maxillary sinus tenderness.     Left Sinus: No maxillary sinus tenderness.  Eyes:     General: No scleral icterus.       Right eye: No discharge.     Conjunctiva/sclera: Conjunctivae normal.     Comments: Left orbit surgically absent  Neck:     Thyroid: No thyromegaly.     Vascular: No carotid bruit.  Cardiovascular:     Rate and Rhythm: Normal rate and regular rhythm.     Pulses: Normal pulses.     Heart sounds: Normal heart sounds.  Pulmonary:     Effort: Pulmonary effort is normal. No respiratory distress.     Breath sounds: No wheezing.  Chest:     Breasts:        Right: No mass, nipple discharge, skin change or tenderness.        Left: No mass, nipple discharge, skin change or tenderness.  Abdominal:     General: Bowel sounds are normal.     Palpations: Abdomen is soft.     Tenderness: There is no abdominal tenderness.  Musculoskeletal:     Cervical back: Normal range of motion. No erythema.     Right  lower leg: No edema.     Left lower leg: No edema.  Lymphadenopathy:     Cervical: No cervical adenopathy.  Skin:    General: Skin is warm and dry.     Findings: No rash.  Neurological:     Mental Status: She is alert and oriented to person, place, and time.     Cranial Nerves: No cranial nerve deficit.     Sensory: No sensory deficit.     Deep Tendon Reflexes: Reflexes are normal and symmetric.  Psychiatric:        Attention and Perception: Attention normal.        Mood and Affect: Mood normal.     Wt Readings from Last 3  Encounters:  01/08/20 (!) 94 lb (42.6 kg)  12/17/19 94 lb 3.2 oz (42.7 kg)  04/10/19 96 lb (43.5 kg)    BP (!) 130/74    Pulse 87    Temp 97.8 F (36.6 C) (Oral)    Ht 4\' 11"  (1.499 m)    Wt (!) 94 lb (42.6 kg)    SpO2 96%    BMI 18.99 kg/m   Assessment and Plan: 1. Essential (primary) hypertension Clinically stable exam with well controlled BP on losartan taken every day-every other day. Tolerating medications without side effects at this time. Pt to continue current regimen and low sodium diet; benefits of regular exercise as able discussed. - CBC with Differential/Platelet - Comprehensive metabolic panel - TSH - POCT urinalysis dipstick - negative 2 weeks ago - losartan (COZAAR) 25 MG tablet; Take 1 tablet (25 mg total) by mouth daily.  Dispense: 90 tablet; Refill: 3  2. Acquired absence of one eye Difficulty with driving so depends on family  3. Primary osteoarthritis of right hand Stable sx - not taking any medication at this time and remains active doing yard work  4. Gastro-esophageal reflux disease without esophagitis Symptoms well controlled on no medications No red flag signs such as weight loss, n/v, melena Will continue to monitor - pt to call if worsening but declines medication at this time.    Partially dictated using Editor, commissioning. Any errors are unintentional.  Halina Maidens, MD [facility]  01/08/2020

## 2020-01-08 ENCOUNTER — Ambulatory Visit (INDEPENDENT_AMBULATORY_CARE_PROVIDER_SITE_OTHER): Payer: Medicare Other | Admitting: Internal Medicine

## 2020-01-08 ENCOUNTER — Other Ambulatory Visit: Payer: Self-pay

## 2020-01-08 ENCOUNTER — Encounter: Payer: Self-pay | Admitting: Internal Medicine

## 2020-01-08 VITALS — BP 130/74 | HR 87 | Temp 97.8°F | Ht 59.0 in | Wt 94.0 lb

## 2020-01-08 DIAGNOSIS — I10 Essential (primary) hypertension: Secondary | ICD-10-CM | POA: Diagnosis not present

## 2020-01-08 DIAGNOSIS — K219 Gastro-esophageal reflux disease without esophagitis: Secondary | ICD-10-CM | POA: Diagnosis not present

## 2020-01-08 DIAGNOSIS — M19041 Primary osteoarthritis, right hand: Secondary | ICD-10-CM

## 2020-01-08 DIAGNOSIS — Z9001 Acquired absence of eye: Secondary | ICD-10-CM

## 2020-01-08 MED ORDER — LOSARTAN POTASSIUM 25 MG PO TABS: 25.0000 mg | ORAL_TABLET | Freq: Every day | ORAL | 3 refills | Status: DC

## 2020-01-08 NOTE — Patient Instructions (Signed)
Call [facility] or [facility] about getting the Covid vaccine

## 2020-01-09 LAB — COMPREHENSIVE METABOLIC PANEL
ALT: 5 IU/L (ref 0–32)
AST: 16 IU/L (ref 0–40)
Albumin/Globulin Ratio: 1.8 (ref 1.2–2.2)
Albumin: 4.4 g/dL (ref 3.6–4.6)
Alkaline Phosphatase: 121 IU/L (ref 48–121)
BUN/Creatinine Ratio: 19 (ref 12–28)
BUN: 15 mg/dL (ref 8–27)
Bilirubin Total: 0.5 mg/dL (ref 0.0–1.2)
CO2: 23 mmol/L (ref 20–29)
Calcium: 9.3 mg/dL (ref 8.7–10.3)
Chloride: 104 mmol/L (ref 96–106)
Creatinine, Ser: 0.78 mg/dL (ref 0.57–1.00)
GFR calc Af Amer: 81 mL/min/{1.73_m2} (ref >59)
GFR calc non Af Amer: 70 mL/min/{1.73_m2} (ref >59)
Globulin, Total: 2.4 g/dL (ref 1.5–4.5)
Glucose: 104 mg/dL — ABNORMAL HIGH (ref 65–99)
Potassium: 3.8 mmol/L (ref 3.5–5.2)
Sodium: 142 mmol/L (ref 134–144)
Total Protein: 6.8 g/dL (ref 6.0–8.5)

## 2020-01-09 LAB — CBC WITH DIFFERENTIAL/PLATELET
Basophils Absolute: 0 10*3/uL (ref 0.0–0.2)
Basos: 1 %
EOS (ABSOLUTE): 0.2 10*3/uL (ref 0.0–0.4)
Eos: 3 %
Hematocrit: 36.9 % (ref 34.0–46.6)
Hemoglobin: 12.2 g/dL (ref 11.1–15.9)
Immature Grans (Abs): 0 10*3/uL (ref 0.0–0.1)
Immature Granulocytes: 0 %
Lymphocytes Absolute: 1.3 10*3/uL (ref 0.7–3.1)
Lymphs: 24 %
MCH: 31.4 pg (ref 26.6–33.0)
MCHC: 33.1 g/dL (ref 31.5–35.7)
MCV: 95 fL (ref 79–97)
Monocytes Absolute: 0.5 10*3/uL (ref 0.1–0.9)
Monocytes: 9 %
Neutrophils Absolute: 3.5 10*3/uL (ref 1.4–7.0)
Neutrophils: 63 %
Platelets: 195 10*3/uL (ref 150–450)
RBC: 3.89 x10E6/uL (ref 3.77–5.28)
RDW: 12.6 % (ref 11.7–15.4)
WBC: 5.5 10*3/uL (ref 3.4–10.8)

## 2020-01-09 LAB — TSH: TSH: 3.55 u[IU]/mL (ref 0.450–4.500)

## 2020-05-13 ENCOUNTER — Ambulatory Visit (INDEPENDENT_AMBULATORY_CARE_PROVIDER_SITE_OTHER): Payer: Medicare Other | Admitting: Internal Medicine

## 2020-05-13 ENCOUNTER — Other Ambulatory Visit: Payer: Self-pay

## 2020-05-13 ENCOUNTER — Encounter: Payer: Self-pay | Admitting: Internal Medicine

## 2020-05-13 VITALS — BP 122/62 | HR 59 | Temp 98.0°F | Ht 59.0 in | Wt 96.0 lb

## 2020-05-13 DIAGNOSIS — I10 Essential (primary) hypertension: Secondary | ICD-10-CM | POA: Diagnosis not present

## 2020-05-13 NOTE — Progress Notes (Signed)
Date:  05/13/2020   Name:  Tammy Mcgee   DOB:  July 16, 1932   MRN:  [id number]   Chief Complaint: Hypertension (f/u)  Hypertension This is a chronic problem. The problem is controlled. Pertinent negatives include no chest pain, headaches or shortness of breath. Past treatments include angiotensin blockers. The current treatment provides significant improvement. There are no compliance problems.  There is no history of kidney disease, CAD/MI, CVA or PVD.    Lab Results  Component Value Date   CREATININE 0.78 01/08/2020   BUN 15 01/08/2020   NA 142 01/08/2020   K 3.8 01/08/2020   CL 104 01/08/2020   CO2 23 01/08/2020   Lab Results  Component Value Date   CHOL 211 (H) 01/06/2019   HDL 76 01/06/2019   LDLCALC 118 (H) 01/06/2019   TRIG 84 01/06/2019   CHOLHDL 2.8 01/06/2019   Lab Results  Component Value Date   TSH 3.550 01/08/2020   No results found for: HGBA1C Lab Results  Component Value Date   WBC 5.5 01/08/2020   HGB 12.2 01/08/2020   HCT 36.9 01/08/2020   MCV 95 01/08/2020   PLT 195 01/08/2020   Lab Results  Component Value Date   ALT 5 01/08/2020   AST 16 01/08/2020   ALKPHOS 121 01/08/2020   BILITOT 0.5 01/08/2020     Review of Systems  Constitutional: Negative for chills, fatigue, fever and unexpected weight change.  Eyes: Negative for visual disturbance.  Respiratory: Negative for cough, chest tightness, shortness of breath and wheezing.   Cardiovascular: Negative for chest pain and leg swelling.  Neurological: Negative for dizziness and headaches.  Psychiatric/Behavioral: Negative for dysphoric mood and sleep disturbance.    Patient Active Problem List   Diagnosis Date Noted  . Acquired absence of one eye 04/10/2019  . Primary osteoarthritis of right hand 09/05/2017  . Bradycardia 04/08/2015  . Central retinal vein occlusion 04/08/2015  . Essential (primary) hypertension 04/08/2015  . Gastro-esophageal reflux disease without esophagitis  04/08/2015  . Glaucoma 04/08/2015  . History of hearing problem 04/08/2015  . Hypercholesterolemia 04/08/2015  . History of colon cancer in adulthood 04/08/2015    No Known Allergies  Past Surgical History:  Procedure Laterality Date  . APPENDECTOMY    . CATARACT EXTRACTION W/ INTRAOCULAR LENS IMPLANT Right 06/17/2015  . CERVIX LESION DESTRUCTION    . ESOPHAGOGASTRODUODENOSCOPY  2004   HH  . EYE SURGERY  11/18/2018   left eye removed due to glaucoma  . PARTIAL COLECTOMY  2004   colon cancer    Social History   Tobacco Use  . Smoking status: Never Smoker  . Smokeless tobacco: Never Used  . Tobacco comment: smoking cessation materials not required  Vaping Use  . Vaping Use: Never used  Substance Use Topics  . Alcohol use: No    Alcohol/week: 0.0 standard drinks  . Drug use: No     Medication list has been reviewed and updated.  Current Meds  Medication Sig  . brimonidine-timolol (COMBIGAN) 0.2-0.5 % ophthalmic solution Apply to eye.  . losartan (COZAAR) 25 MG tablet Take 1 tablet (25 mg total) by mouth daily.    PHQ 2/9 Scores 05/13/2020 12/17/2019 04/10/2019 01/06/2019  PHQ - 2 Score 0 0 0 0  PHQ- 9 Score 0 - - -    GAD 7 : Generalized Anxiety Score 05/13/2020  Nervous, Anxious, on Edge 0  Control/stop worrying 0  Worry too much - different things 0  Trouble  relaxing 0  Restless 0  Easily annoyed or irritable 0  Afraid - awful might happen 0  Total GAD 7 Score 0    BP Readings from Last 3 Encounters:  05/13/20 122/62  01/08/20 (!) 130/74  12/17/19 120/64    Physical Exam Vitals and nursing note reviewed.  Constitutional:      General: She is not in acute distress.    Appearance: Normal appearance. She is well-developed.  HENT:     Head: Normocephalic and atraumatic.  Neck:     Vascular: No carotid bruit.  Cardiovascular:     Rate and Rhythm: Normal rate and regular rhythm.     Pulses: Normal pulses.     Heart sounds: No murmur heard.    Pulmonary:     Effort: Pulmonary effort is normal. No respiratory distress.     Breath sounds: No wheezing or rhonchi.  Musculoskeletal:     Cervical back: Normal range of motion.     Right lower leg: No edema.     Left lower leg: No edema.  Lymphadenopathy:     Cervical: No cervical adenopathy.  Skin:    General: Skin is warm and dry.     Findings: No rash.  Neurological:     General: No focal deficit present.     Mental Status: She is alert and oriented to person, place, and time.  Psychiatric:        Mood and Affect: Mood normal.     Wt Readings from Last 3 Encounters:  05/13/20 96 lb (43.5 kg)  01/08/20 (!) 94 lb (42.6 kg)  12/17/19 94 lb 3.2 oz (42.7 kg)    BP 122/62   Pulse (!) 59   Temp 98 F (36.7 C) (Oral)   Ht 4\' 11"  (1.499 m)   Wt 96 lb (43.5 kg)   SpO2 97%   BMI 19.39 kg/m   Assessment and Plan: 1. Essential (primary) hypertension BP well controlled on losartan Continue regular exercise, healthy diet Recent labs normal.   Partially dictated using Editor, commissioning. Any errors are unintentional.  Halina Maidens, MD [facility]  05/13/2020

## 2020-06-29 DIAGNOSIS — L821 Other seborrheic keratosis: Secondary | ICD-10-CM | POA: Diagnosis not present

## 2020-06-29 DIAGNOSIS — Z85828 Personal history of other malignant neoplasm of skin: Secondary | ICD-10-CM | POA: Diagnosis not present

## 2020-06-29 DIAGNOSIS — L570 Actinic keratosis: Secondary | ICD-10-CM | POA: Diagnosis not present

## 2020-06-29 DIAGNOSIS — L814 Other melanin hyperpigmentation: Secondary | ICD-10-CM | POA: Diagnosis not present

## 2020-06-29 DIAGNOSIS — L820 Inflamed seborrheic keratosis: Secondary | ICD-10-CM | POA: Diagnosis not present

## 2020-07-20 DIAGNOSIS — H26491 Other secondary cataract, right eye: Secondary | ICD-10-CM | POA: Diagnosis not present

## 2020-07-20 DIAGNOSIS — D3131 Benign neoplasm of right choroid: Secondary | ICD-10-CM | POA: Diagnosis not present

## 2020-07-20 DIAGNOSIS — H401131 Primary open-angle glaucoma, bilateral, mild stage: Secondary | ICD-10-CM | POA: Diagnosis not present

## 2020-07-20 DIAGNOSIS — H43811 Vitreous degeneration, right eye: Secondary | ICD-10-CM | POA: Diagnosis not present

## 2020-08-12 DIAGNOSIS — D3131 Benign neoplasm of right choroid: Secondary | ICD-10-CM | POA: Diagnosis not present

## 2020-08-12 DIAGNOSIS — H401131 Primary open-angle glaucoma, bilateral, mild stage: Secondary | ICD-10-CM | POA: Diagnosis not present

## 2020-08-12 DIAGNOSIS — H43811 Vitreous degeneration, right eye: Secondary | ICD-10-CM | POA: Diagnosis not present

## 2020-08-12 DIAGNOSIS — H26491 Other secondary cataract, right eye: Secondary | ICD-10-CM | POA: Diagnosis not present

## 2020-09-22 DIAGNOSIS — H26491 Other secondary cataract, right eye: Secondary | ICD-10-CM | POA: Diagnosis not present

## 2020-09-22 DIAGNOSIS — H43811 Vitreous degeneration, right eye: Secondary | ICD-10-CM | POA: Diagnosis not present

## 2020-09-22 DIAGNOSIS — D3131 Benign neoplasm of right choroid: Secondary | ICD-10-CM | POA: Diagnosis not present

## 2020-09-22 DIAGNOSIS — H401131 Primary open-angle glaucoma, bilateral, mild stage: Secondary | ICD-10-CM | POA: Diagnosis not present

## 2020-11-02 ENCOUNTER — Other Ambulatory Visit: Payer: Self-pay

## 2020-11-02 ENCOUNTER — Ambulatory Visit (INDEPENDENT_AMBULATORY_CARE_PROVIDER_SITE_OTHER): Payer: Medicare Other | Admitting: Internal Medicine

## 2020-11-02 ENCOUNTER — Encounter: Payer: Self-pay | Admitting: Internal Medicine

## 2020-11-02 VITALS — BP 150/72 | HR 59 | Temp 97.7°F | Ht 59.0 in | Wt 99.0 lb

## 2020-11-02 DIAGNOSIS — E7800 Pure hypercholesterolemia, unspecified: Secondary | ICD-10-CM

## 2020-11-02 DIAGNOSIS — I10 Essential (primary) hypertension: Secondary | ICD-10-CM

## 2020-11-02 DIAGNOSIS — R0989 Other specified symptoms and signs involving the circulatory and respiratory systems: Secondary | ICD-10-CM

## 2020-11-02 MED ORDER — LOSARTAN POTASSIUM 25 MG PO TABS: 25.0000 mg | ORAL_TABLET | Freq: Every day | ORAL | 3 refills | Status: DC

## 2020-11-02 NOTE — Progress Notes (Signed)
Date:  11/02/2020   Name:  Tammy Mcgee   DOB:  30-Oct-1932   MRN:  [id number]   Chief Complaint: Hypertension  Hypertension This is a chronic problem. The problem is controlled. Pertinent negatives include no chest pain, headaches, palpitations or shortness of breath. Past treatments include angiotensin blockers. There is no history of kidney disease, CAD/MI or CVA.    Lab Results  Component Value Date   CREATININE 0.78 01/08/2020   BUN 15 01/08/2020   NA 142 01/08/2020   K 3.8 01/08/2020   CL 104 01/08/2020   CO2 23 01/08/2020   Lab Results  Component Value Date   CHOL 211 (H) 01/06/2019   HDL 76 01/06/2019   LDLCALC 118 (H) 01/06/2019   TRIG 84 01/06/2019   CHOLHDL 2.8 01/06/2019   Lab Results  Component Value Date   TSH 3.550 01/08/2020   No results found for: HGBA1C Lab Results  Component Value Date   WBC 5.5 01/08/2020   HGB 12.2 01/08/2020   HCT 36.9 01/08/2020   MCV 95 01/08/2020   PLT 195 01/08/2020   Lab Results  Component Value Date   ALT 5 01/08/2020   AST 16 01/08/2020   ALKPHOS 121 01/08/2020   BILITOT 0.5 01/08/2020     Review of Systems  Constitutional: Negative for fatigue and unexpected weight change.  HENT: Positive for hearing loss. Negative for nosebleeds.   Eyes: Negative for visual disturbance.  Respiratory: Negative for cough, chest tightness, shortness of breath and wheezing.   Cardiovascular: Negative for chest pain, palpitations and leg swelling.  Gastrointestinal: Negative for abdominal pain, constipation and diarrhea.  Musculoskeletal: Negative for arthralgias, gait problem and joint swelling.  Neurological: Negative for dizziness, weakness, light-headedness and headaches.  Psychiatric/Behavioral: Negative for dysphoric mood and sleep disturbance. The patient is not nervous/anxious.     Patient Active Problem List   Diagnosis Date Noted  . Acquired absence of one eye 04/10/2019  . Primary osteoarthritis of right hand  09/05/2017  . Bradycardia 04/08/2015  . Central retinal vein occlusion 04/08/2015  . Essential (primary) hypertension 04/08/2015  . Gastro-esophageal reflux disease without esophagitis 04/08/2015  . Glaucoma 04/08/2015  . History of hearing problem 04/08/2015  . Hypercholesterolemia 04/08/2015  . History of colon cancer in adulthood 04/08/2015    No Known Allergies  Past Surgical History:  Procedure Laterality Date  . APPENDECTOMY    . CATARACT EXTRACTION W/ INTRAOCULAR LENS IMPLANT Right 06/17/2015  . CERVIX LESION DESTRUCTION    . ESOPHAGOGASTRODUODENOSCOPY  2004   HH  . EYE SURGERY  11/18/2018   left eye removed due to glaucoma  . PARTIAL COLECTOMY  2004   colon cancer    Social History   Tobacco Use  . Smoking status: Never Smoker  . Smokeless tobacco: Never Used  . Tobacco comment: smoking cessation materials not required  Vaping Use  . Vaping Use: Never used  Substance Use Topics  . Alcohol use: No    Alcohol/week: 0.0 standard drinks  . Drug use: No     Medication list has been reviewed and updated.  Current Meds  Medication Sig  . brimonidine-timolol (COMBIGAN) 0.2-0.5 % ophthalmic solution Apply to eye.  . latanoprost (XALATAN) 0.005 % ophthalmic solution SMARTSIG:1 Drop(s) Right Eye Every Evening  . [DISCONTINUED] losartan (COZAAR) 25 MG tablet Take 1 tablet (25 mg total) by mouth daily.    PHQ 2/9 Scores 11/02/2020 05/13/2020 12/17/2019 04/10/2019  PHQ - 2 Score 0 0 0 0  PHQ- 9 Score 2 0 - -    GAD 7 : Generalized Anxiety Score 11/02/2020 05/13/2020  Nervous, Anxious, on Edge 0 0  Control/stop worrying 0 0  Worry too much - different things 0 0  Trouble relaxing 0 0  Restless 0 0  Easily annoyed or irritable 0 0  Afraid - awful might happen 0 0  Total GAD 7 Score 0 0    BP Readings from Last 3 Encounters:  11/02/20 (!) 150/72  05/13/20 122/62  01/08/20 (!) 130/74    Physical Exam Vitals and nursing note reviewed.  Constitutional:       General: She is not in acute distress.    Appearance: She is well-developed.  HENT:     Head: Normocephalic and atraumatic.  Neck:     Vascular: Carotid bruit (on left) present.  Cardiovascular:     Rate and Rhythm: Normal rate and regular rhythm.     Pulses: Normal pulses.     Heart sounds: No murmur heard.   Pulmonary:     Effort: Pulmonary effort is normal. No respiratory distress.     Breath sounds: No wheezing or rhonchi.  Musculoskeletal:     Right lower leg: No edema.     Left lower leg: No edema.  Lymphadenopathy:     Cervical: No cervical adenopathy.  Skin:    General: Skin is warm and dry.     Findings: No rash.  Neurological:     Mental Status: She is alert and oriented to person, place, and time.  Psychiatric:        Mood and Affect: Mood normal.        Behavior: Behavior normal.     Wt Readings from Last 3 Encounters:  11/02/20 99 lb (44.9 kg)  05/13/20 96 lb (43.5 kg)  01/08/20 (!) 94 lb (42.6 kg)    BP (!) 150/72   Pulse (!) 59   Temp 97.7 F (36.5 C) (Oral)   Ht 4\' 11"  (1.499 m)   Wt 99 lb (44.9 kg)   SpO2 98%   BMI 20.00 kg/m   Assessment and Plan: 1. Essential (primary) hypertension BP fairly well controlled - pt is taking medication most days - encourage daily dosing - Comprehensive metabolic panel - CBC with Differential/Platelet - TSH - losartan (COZAAR) 25 MG tablet; Take 1 tablet (25 mg total) by mouth daily.  Dispense: 90 tablet; Refill: 3  2. Hypercholesterolemia Not currently on statin therapy - Lipid panel  3. Left carotid bruit New bruit on exam - will get [place] then advise on statin, aspirin, or VS referral. - US Carotid Duplex Bilateral; Future   Partially dictated using Editor, commissioning. Any errors are unintentional.  Halina Maidens, MD [facility]  11/02/2020

## 2020-11-03 LAB — COMPREHENSIVE METABOLIC PANEL
ALT: 8 IU/L (ref 0–32)
AST: 19 IU/L (ref 0–40)
Albumin/Globulin Ratio: 1.8 (ref 1.2–2.2)
Albumin: 4.2 g/dL (ref 3.6–4.6)
Alkaline Phosphatase: 121 IU/L (ref 44–121)
BUN/Creatinine Ratio: 23 (ref 12–28)
BUN: 16 mg/dL (ref 8–27)
Bilirubin Total: 0.5 mg/dL (ref 0.0–1.2)
CO2: 24 mmol/L (ref 20–29)
Calcium: 9.3 mg/dL (ref 8.7–10.3)
Chloride: 102 mmol/L (ref 96–106)
Creatinine, Ser: 0.71 mg/dL (ref 0.57–1.00)
Globulin, Total: 2.4 g/dL (ref 1.5–4.5)
Glucose: 91 mg/dL (ref 65–99)
Potassium: 4.5 mmol/L (ref 3.5–5.2)
Sodium: 140 mmol/L (ref 134–144)
Total Protein: 6.6 g/dL (ref 6.0–8.5)
eGFR: 83 mL/min/{1.73_m2} (ref >59)

## 2020-11-03 LAB — LIPID PANEL
Chol/HDL Ratio: 3 ratio (ref 0.0–4.4)
Cholesterol, Total: 233 mg/dL — ABNORMAL HIGH (ref 100–199)
HDL: 78 mg/dL (ref >39)
LDL Chol Calc (NIH): 140 mg/dL — ABNORMAL HIGH (ref 0–99)
Triglycerides: 87 mg/dL (ref 0–149)
VLDL Cholesterol Cal: 15 mg/dL (ref 5–40)

## 2020-11-03 LAB — CBC WITH DIFFERENTIAL/PLATELET
Basophils Absolute: 0 10*3/uL (ref 0.0–0.2)
Basos: 0 %
EOS (ABSOLUTE): 0.2 10*3/uL (ref 0.0–0.4)
Eos: 3 %
Hematocrit: 37.9 % (ref 34.0–46.6)
Hemoglobin: 12.6 g/dL (ref 11.1–15.9)
Immature Grans (Abs): 0 10*3/uL (ref 0.0–0.1)
Immature Granulocytes: 0 %
Lymphocytes Absolute: 1.3 10*3/uL (ref 0.7–3.1)
Lymphs: 19 %
MCH: 31 pg (ref 26.6–33.0)
MCHC: 33.2 g/dL (ref 31.5–35.7)
MCV: 93 fL (ref 79–97)
Monocytes Absolute: 0.5 10*3/uL (ref 0.1–0.9)
Monocytes: 8 %
Neutrophils Absolute: 4.7 10*3/uL (ref 1.4–7.0)
Neutrophils: 70 %
Platelets: 202 10*3/uL (ref 150–450)
RBC: 4.07 x10E6/uL (ref 3.77–5.28)
RDW: 12.6 % (ref 11.7–15.4)
WBC: 6.8 10*3/uL (ref 3.4–10.8)

## 2020-11-03 LAB — TSH: TSH: 3.95 u[IU]/mL (ref 0.450–4.500)

## 2020-12-15 ENCOUNTER — Ambulatory Visit
Admission: RE | Admit: 2020-12-15 | Discharge: 2020-12-15 | Disposition: A | Discharge status: Home / Self Care | Payer: Medicare Other | Source: Ambulatory Visit | Attending: Internal Medicine | Admitting: Internal Medicine

## 2020-12-15 ENCOUNTER — Other Ambulatory Visit: Payer: Self-pay

## 2020-12-15 DIAGNOSIS — I6523 Occlusion and stenosis of bilateral carotid arteries: Secondary | ICD-10-CM | POA: Diagnosis not present

## 2020-12-15 DIAGNOSIS — R0989 Other specified symptoms and signs involving the circulatory and respiratory systems: Secondary | ICD-10-CM | POA: Diagnosis not present

## 2020-12-20 ENCOUNTER — Other Ambulatory Visit: Payer: Self-pay

## 2020-12-20 ENCOUNTER — Ambulatory Visit (INDEPENDENT_AMBULATORY_CARE_PROVIDER_SITE_OTHER): Payer: Medicare Other

## 2020-12-20 VITALS — BP 102/66 | HR 61 | Temp 97.7°F | Resp 16 | Ht 59.0 in | Wt 95.2 lb

## 2020-12-20 DIAGNOSIS — Z0000 Encounter for general adult medical examination without abnormal findings: Secondary | ICD-10-CM | POA: Diagnosis not present

## 2020-12-20 NOTE — Progress Notes (Signed)
Subjective:   Tammy Mcgee is a 87 y.o. female who presents for Medicare Annual (Subsequent) preventive examination.  Review of Systems     Cardiac Risk Factors include: hypertension;advanced age (>10men, >13 women)     Objective:    Today's Vitals   01-12-21 1133  BP: 102/66  Pulse: 61  Resp: 16  Temp: 97.7 F (36.5 C)  TempSrc: Oral  SpO2: 99%  Weight: 95 lb 3.2 oz (43.2 kg)  Height: 4\' 11"  (1.499 m)   Body mass index is 19.23 kg/m.  Advanced Directives 01-12-2021 12/17/2019 12/11/2018 08/30/2017 09/04/2016 09/03/2015  Does Patient Have a Medical Advance Directive? No No No No No No  Would patient like information on creating a medical advance directive? Yes (MAU/Ambulatory/Procedural Areas - Information given) Yes (MAU/Ambulatory/Procedural Areas - Information given) No - Patient declined Yes (MAU/Ambulatory/Procedural Areas - Information given) - No - patient declined information    Current Medications (verified) Outpatient Encounter Medications as of Jan 12, 2021  Medication Sig   brimonidine-timolol (COMBIGAN) 0.2-0.5 % ophthalmic solution Apply to eye.   latanoprost (XALATAN) 0.005 % ophthalmic solution SMARTSIG:1 Drop(s) Right Eye Every Evening   losartan (COZAAR) 25 MG tablet Take 1 tablet (25 mg total) by mouth daily.   No facility-administered encounter medications on file as of 2021-01-12.    Allergies (verified) Patient has no known allergies.   History: Past Medical History:  Diagnosis Date   Blind painful eye 09/04/2016   Added automatically from request for surgery [id number]   Blindness of one eye with low vision in contralateral eye 04/08/2015   GERD (gastroesophageal reflux disease)    Glaucoma    Hyperlipidemia    Hypertension    Legally blind in left eye, as defined in [place]    Past Surgical History:  Procedure Laterality Date   APPENDECTOMY     CATARACT EXTRACTION W/ INTRAOCULAR LENS IMPLANT Right 06/17/2015   CERVIX LESION DESTRUCTION      ESOPHAGOGASTRODUODENOSCOPY  2004   [facility]   EYE SURGERY  11/18/2018   left eye removed due to glaucoma   PARTIAL COLECTOMY  2004   colon cancer   Family History  Problem Relation Age of Onset   Hypertension Mother    CAD Father    Heart attack Father    Stroke Sister    Social History   Socioeconomic History   Marital status: Divorced    Spouse name: Not on file   Number of children: 4   Years of education: GED   Highest education level: 8th grade  Occupational History   Occupation: Retired  Tobacco Use   Smoking status: Never   Smokeless tobacco: Never   Tobacco comments:    smoking cessation materials not required  Vaping Use   Vaping Use: Never used  Substance and Sexual Activity   Alcohol use: No    Alcohol/week: 0.0 standard drinks   Drug use: No   Sexual activity: Not Currently  Other Topics Concern   Not on file  Social History Narrative   Pt's daughter lives with her; still drives but unsure how long she will continue 01/12/21   2 sons have passed away   Social Determinants of Health   Financial Resource Strain: Low Risk    Difficulty of Paying Living Expenses: Not hard at all  Food Insecurity: No Food Insecurity   Worried About Charity fundraiser in the Last Year: Never true   Ran Out of Food in the Last Year: Never true  Transportation Needs: No Data processing manager (Medical): No   Lack of Transportation (Non-Medical): No  Physical Activity: Insufficiently Active   Days of Exercise per Week: 7 days   Minutes of Exercise per Session: 10 min  Stress: No Stress Concern Present   Feeling of Stress : Not at all  Social Connections: Socially Isolated   Frequency of Communication with Friends and Family: More than three times a week   Frequency of Social Gatherings with Friends and Family: More than three times a week   Attends Religious Services: Never   Marine scientist or Organizations: No   Attends Arts administrator: Never   Marital Status: Divorced    Tobacco Counseling Counseling given: Not Answered Tobacco comments: smoking cessation materials not required   Clinical Intake:  Pre-visit preparation completed: Yes  Pain : No/denies pain     BMI - recorded: 19.23 Nutritional Status: BMI of 19-24  Normal Nutritional Risks: None Diabetes: No  How often do you need to have someone help you when you read instructions, pamphlets, or other written materials from your doctor or pharmacy?: 1 - Never    Interpreter Needed?: No  Information entered by :: Clemetine Marker LPN   Activities of Daily Living In your present state of health, do you have any difficulty performing the following activities: 12/20/2020 11/02/2020  Hearing? Y Y  Comment declines hearing aids -  Vision? Y Y  Difficulty concentrating or making decisions? Tempie Donning  Walking or climbing stairs? Y N  Dressing or bathing? N N  Doing errands, shopping? N N  Preparing Food and eating ? N -  Using the Toilet? N -  In the past six months, have you accidently leaked urine? N -  Do you have problems with loss of bowel control? N -  Managing your Medications? N -  Managing your Finances? N -  Housekeeping or managing your Housekeeping? N -  Some recent data might be hidden    Patient Care Team: Glean Hess, MD as PCP - General (Internal Medicine)  Indicate any recent Medical Services you may have received from other than [facility] providers in the past year (date may be approximate).     Assessment:   This is a routine wellness examination for Tammy Mcgee.  Hearing/Vision screen Hearing Screening - Comments:: Pt c/o poor hearing but does not wear her hearing aids bc she says they do not help Vision Screening - Comments:: Annual vision screenings with Dr. Raynelle Dick  Dietary issues and exercise activities discussed: Current Exercise Habits: Home exercise routine, Type of exercise: calisthenics, Time (Minutes): 10, Frequency  (Times/Week): 7, Weekly Exercise (Minutes/Week): 70, Intensity: Mild, Exercise limited by: orthopedic condition(s)   Goals Addressed             This Visit's Progress    DIET - INCREASE WATER INTAKE   Not on track    Recommend to drink at least 6-8 8oz glasses of water per day.        Depression Screen PHQ 2/9 Scores 12/20/2020 11/02/2020 05/13/2020 12/17/2019 04/10/2019 01/06/2019 12/11/2018  PHQ - 2 Score 0 0 0 0 0 0 0  PHQ- 9 Score - 2 0 - - - -    Fall Risk Fall Risk  12/20/2020 11/02/2020 05/13/2020 12/17/2019 01/06/2019  Falls in the past year? 1 1 1 1 1   Comment - - - - -  Number falls in past yr: 1 - 1 1 1  Injury with Fall? 1 0 0 0 1  Risk Factor Category  - - - - -  Risk for fall due to : History of fall(s);Impaired balance/gait;Impaired vision - - History of fall(s) History of fall(s);Impaired vision  Risk for fall due to: Comment - - - - -  Follow up Falls prevention discussed Falls evaluation completed Falls evaluation completed Falls prevention discussed -    FALL RISK PREVENTION PERTAINING TO THE HOME:  Any stairs in or around the home? Yes  If so, are there any without handrails? No  Home free of loose throw rugs in walkways, pet beds, electrical cords, etc? Yes  Adequate lighting in your home to reduce risk of falls? Yes   ASSISTIVE DEVICES UTILIZED TO PREVENT FALLS:  Life alert? No  Use of a cane, walker or w/c? No  Grab bars in the bathroom? Yes  Shower chair or bench in shower? No  Elevated toilet seat or a handicapped toilet? No   TIMED UP AND GO:  Was the test performed? Yes .  Length of time to ambulate 10 feet: 6 sec.   Gait slow and steady without use of assistive device  Cognitive Function:     6CIT Screen 12/20/2020 12/17/2019 12/11/2018 08/30/2017 09/04/2016  What Year? 0 points 0 points 0 points 0 points 0 points  What month? 0 points 0 points 0 points 0 points 0 points  What time? 0 points 0 points 0 points 0 points 0 points  Count back from 20 0  points 0 points 0 points 0 points 0 points  Months in reverse 0 points 0 points 0 points 0 points 0 points  Repeat phrase 6 points 6 points 2 points 8 points 0 points  Total Score 6 6 2 8  0    Immunizations Immunization History  Administered Date(s) Administered   Influenza-Unspecified 06/12/2018   PFIZER(Purple Top)SARS-COV-2 Vaccination 02/10/2020, 03/02/2020, 08/30/2020   Tdap 09/04/2016    TDAP status: Up to date  Flu Vaccine status: Declined, Education has been provided regarding the importance of this vaccine but patient still declined. Advised may receive this vaccine at local pharmacy or Health Dept. Aware to provide a copy of the vaccination record if obtained from local pharmacy or Health Dept. Verbalized acceptance and understanding.  Pneumococcal vaccine status: Declined,  Education has been provided regarding the importance of this vaccine but patient still declined. Advised may receive this vaccine at local pharmacy or Health Dept. Aware to provide a copy of the vaccination record if obtained from local pharmacy or Health Dept. Verbalized acceptance and understanding.   Covid-19 vaccine status: Completed vaccines  Qualifies for Shingles Vaccine? Yes   Zostavax completed No   Shingrix Completed?: No.    Education has been provided regarding the importance of this vaccine. Patient has been advised to call insurance company to determine out of pocket expense if they have not yet received this vaccine. Advised may also receive vaccine at local pharmacy or Health Dept. Verbalized acceptance and understanding.  Screening Tests Health Maintenance  Topic Date Due   Zoster Vaccines- Shingrix (1 of 2) Never done   PNA vac Low Risk Adult (1 of 2 - PCV13) Never done   COVID-19 Vaccine (4 - Booster for Pfizer series) 12/30/2020   INFLUENZA VACCINE  01/10/2021   TETANUS/TDAP  09/05/2026   HPV VACCINES  Aged Out   DEXA SCAN  Discontinued    Health Maintenance  Health  Maintenance Due  Topic Date Due   Zoster  Vaccines- Shingrix (1 of 2) Never done   PNA vac Low Risk Adult (1 of 2 - PCV13) Never done    Colorectal cancer screening: No longer required.   Mammogram status: No longer required due to age.  Bone density status: pt declined  Lung Cancer Screening: (Low Dose CT Chest recommended if Age 18-80 years, 30 pack-year currently smoking OR have quit w/in 15years.) does not qualify.   Additional Screening:  Hepatitis C Screening: does not qualify.  Vision Screening: Recommended annual ophthalmology exams for early detection of glaucoma and other disorders of the eye. Is the patient up to date with their annual eye exam?  Yes Who is the provider or what is the name of the office in which the patient attends annual eye exams? Dr. Clydene Laming.   Dental Screening: Recommended annual dental exams for proper oral hygiene  Community Resource Referral / Chronic Care Management: CRR required this visit?  No   CCM required this visit?  No      Plan:     I have personally reviewed and noted the following in the patient's chart:   Medical and social history Use of alcohol, tobacco or illicit drugs  Current medications and supplements including opioid prescriptions.  Functional ability and status Nutritional status Physical activity Advanced directives List of other physicians Hospitalizations, surgeries, and ER visits in previous 12 months Vitals Screenings to include cognitive, depression, and falls Referrals and appointments  In addition, I have reviewed and discussed with patient certain preventive protocols, quality metrics, and best practice recommendations. A written personalized care plan for preventive services as well as general preventive health recommendations were provided to patient.     Clemetine Marker, LPN   [id number]   Nurse Notes: pt c/o poor balance and frequent falls; plans to discuss with Dr. Army Melia at next visit; pt has not  completed any physical therapy or balance exercises to help with this problem.

## 2020-12-20 NOTE — Patient Instructions (Signed)
Tammy Mcgee , Thank you for taking time to come for your Medicare Wellness Visit. I appreciate your ongoing commitment to your health goals. Please review the following plan we discussed and let me know if I can assist you in the future.   Screening recommendations/referrals: Colonoscopy: no longer required Mammogram: no longer required Bone Density: no longer required Recommended yearly ophthalmology/optometry visit for glaucoma screening and checkup Recommended yearly dental visit for hygiene and checkup  Vaccinations: Influenza vaccine: declined Pneumococcal vaccine: declined Tdap vaccine: done 09/04/16 Shingles vaccine: Shingrix discussed. Please contact your pharmacy for coverage information.  Covid-19:done 02/10/20, 03/02/20 & 08/30/20  Advanced directives: Advance directive discussed with you today. I have provided a copy for you to complete at home and have notarized. Once this is complete please bring a copy in to our office so we can scan it into your chart.   Conditions/risks identified: Recommend fall prevention in the home  Next appointment: Follow up in one year for your annual wellness visit    Preventive Care 65 Years and Older, Female Preventive care refers to lifestyle choices and visits with your health care provider that can promote health and wellness. What does preventive care include? A yearly physical exam. This is also called an annual well check. Dental exams once or twice a year. Routine eye exams. Ask your health care provider how often you should have your eyes checked. Personal lifestyle choices, including: Daily care of your teeth and gums. Regular physical activity. Eating a healthy diet. Avoiding tobacco and drug use. Limiting alcohol use. Practicing safe sex. Taking low-dose aspirin every day. Taking vitamin and mineral supplements as recommended by your health care provider. What happens during an annual well check? The services and screenings  done by your health care provider during your annual well check will depend on your age, overall health, lifestyle risk factors, and family history of disease. Counseling  Your health care provider may ask you questions about your: Alcohol use. Tobacco use. Drug use. Emotional well-being. Home and relationship well-being. Sexual activity. Eating habits. History of falls. Memory and ability to understand (cognition). Work and work Statistician. Reproductive health. Screening  You may have the following tests or measurements: Height, weight, and BMI. Blood pressure. Lipid and cholesterol levels. These may be checked every 5 years, or more frequently if you are over 38 years old. Skin check. Lung cancer screening. You may have this screening every year starting at age 64 if you have a 30-pack-year history of smoking and currently smoke or have quit within the past 15 years. Fecal occult blood test (FOBT) of the stool. You may have this test every year starting at age 75. Flexible sigmoidoscopy or colonoscopy. You may have a sigmoidoscopy every 5 years or a colonoscopy every 10 years starting at age 48. Hepatitis C blood test. Hepatitis B blood test. Sexually transmitted disease (STD) testing. Diabetes screening. This is done by checking your blood sugar (glucose) after you have not eaten for a while (fasting). You may have this done every 1-3 years. Bone density scan. This is done to screen for osteoporosis. You may have this done starting at age 73. Mammogram. This may be done every 1-2 years. Talk to your health care provider about how often you should have regular mammograms. Talk with your health care provider about your test results, treatment options, and if necessary, the need for more tests. Vaccines  Your health care provider may recommend certain vaccines, such as: Influenza vaccine. This is recommended  every year. Tetanus, diphtheria, and acellular pertussis (Tdap, Td)  vaccine. You may need a Td booster every 10 years. Zoster vaccine. You may need this after age 66. Pneumococcal 13-valent conjugate (PCV13) vaccine. One dose is recommended after age 58. Pneumococcal polysaccharide (PPSV23) vaccine. One dose is recommended after age 32. Talk to your health care provider about which screenings and vaccines you need and how often you need them. This information is not intended to replace advice given to you by your health care provider. Make sure you discuss any questions you have with your health care provider. Document Released: 06/25/2015 Document Revised: 02/16/2016 Document Reviewed: 03/30/2015 Elsevier Interactive Patient Education  2017 [place] Prevention in the Home Falls can cause injuries. They can happen to people of all ages. There are many things you can do to make your home safe and to help prevent falls. What can I do on the outside of my home? Regularly fix the edges of walkways and driveways and fix any cracks. Remove anything that might make you trip as you walk through a door, such as a raised step or threshold. Trim any bushes or trees on the path to your home. Use bright outdoor lighting. Clear any walking paths of anything that might make someone trip, such as rocks or tools. Regularly check to see if handrails are loose or broken. Make sure that both sides of any steps have handrails. Any raised decks and porches should have guardrails on the edges. Have any leaves, snow, or ice cleared regularly. Use sand or salt on walking paths during winter. Clean up any spills in your garage right away. This includes oil or grease spills. What can I do in the bathroom? Use night lights. Install grab bars by the toilet and in the tub and shower. Do not use towel bars as grab bars. Use non-skid mats or decals in the tub or shower. If you need to sit down in the shower, use a plastic, non-slip stool. Keep the floor dry. Clean up any  water that spills on the floor as soon as it happens. Remove soap buildup in the tub or shower regularly. Attach bath mats securely with double-sided non-slip rug tape. Do not have throw rugs and other things on the floor that can make you trip. What can I do in the bedroom? Use night lights. Make sure that you have a light by your bed that is easy to reach. Do not use any sheets or blankets that are too big for your bed. They should not hang down onto the floor. Have a firm chair that has side arms. You can use this for support while you get dressed. Do not have throw rugs and other things on the floor that can make you trip. What can I do in the kitchen? Clean up any spills right away. Avoid walking on wet floors. Keep items that you use a lot in easy-to-reach places. If you need to reach something above you, use a strong step stool that has a grab bar. Keep electrical cords out of the way. Do not use floor polish or wax that makes floors slippery. If you must use wax, use non-skid floor wax. Do not have throw rugs and other things on the floor that can make you trip. What can I do with my stairs? Do not leave any items on the stairs. Make sure that there are handrails on both sides of the stairs and use them. Fix handrails that are broken  or loose. Make sure that handrails are as long as the stairways. Check any carpeting to make sure that it is firmly attached to the stairs. Fix any carpet that is loose or worn. Avoid having throw rugs at the top or bottom of the stairs. If you do have throw rugs, attach them to the floor with carpet tape. Make sure that you have a light switch at the top of the stairs and the bottom of the stairs. If you do not have them, ask someone to add them for you. What else can I do to help prevent falls? Wear shoes that: Do not have high heels. Have rubber bottoms. Are comfortable and fit you well. Are closed at the toe. Do not wear sandals. If you use a  stepladder: Make sure that it is fully opened. Do not climb a closed stepladder. Make sure that both sides of the stepladder are locked into place. Ask someone to hold it for you, if possible. Clearly mark and make sure that you can see: Any grab bars or handrails. First and last steps. Where the edge of each step is. Use tools that help you move around (mobility aids) if they are needed. These include: Canes. Walkers. Scooters. Crutches. Turn on the lights when you go into a dark area. Replace any light bulbs as soon as they burn out. Set up your furniture so you have a clear path. Avoid moving your furniture around. If any of your floors are uneven, fix them. If there are any pets around you, be aware of where they are. Review your medicines with your doctor. Some medicines can make you feel dizzy. This can increase your chance of falling. Ask your doctor what other things that you can do to help prevent falls. This information is not intended to replace advice given to you by your health care provider. Make sure you discuss any questions you have with your health care provider. Document Released: 03/25/2009 Document Revised: 11/04/2015 Document Reviewed: 07/03/2014 Elsevier Interactive Patient Education  2017 [employer].

## 2020-12-22 ENCOUNTER — Other Ambulatory Visit: Payer: Self-pay | Admitting: Internal Medicine

## 2020-12-22 DIAGNOSIS — E7800 Pure hypercholesterolemia, unspecified: Secondary | ICD-10-CM

## 2020-12-22 MED ORDER — ROSUVASTATIN CALCIUM 5 MG PO TABS: 5.0000 mg | ORAL_TABLET | Freq: Every day | ORAL | 1 refills | Status: DC

## 2020-12-22 NOTE — Progress Notes (Signed)
Informed pt with labs. Pt verbalized understanding. Pt does not want to take a baby Asprin a day. Pt does want to start a low dosage statin medication and wants it sent to [facility] in [place].  KP

## 2020-12-23 DIAGNOSIS — D3131 Benign neoplasm of right choroid: Secondary | ICD-10-CM | POA: Diagnosis not present

## 2020-12-23 DIAGNOSIS — H26491 Other secondary cataract, right eye: Secondary | ICD-10-CM | POA: Diagnosis not present

## 2020-12-23 DIAGNOSIS — H43811 Vitreous degeneration, right eye: Secondary | ICD-10-CM | POA: Diagnosis not present

## 2020-12-23 DIAGNOSIS — H401131 Primary open-angle glaucoma, bilateral, mild stage: Secondary | ICD-10-CM | POA: Diagnosis not present

## 2020-12-28 DIAGNOSIS — L820 Inflamed seborrheic keratosis: Secondary | ICD-10-CM | POA: Diagnosis not present

## 2020-12-28 DIAGNOSIS — L570 Actinic keratosis: Secondary | ICD-10-CM | POA: Diagnosis not present

## 2020-12-28 DIAGNOSIS — Z85828 Personal history of other malignant neoplasm of skin: Secondary | ICD-10-CM | POA: Diagnosis not present

## 2020-12-28 DIAGNOSIS — L821 Other seborrheic keratosis: Secondary | ICD-10-CM | POA: Diagnosis not present

## 2021-03-09 ENCOUNTER — Encounter: Payer: Self-pay | Admitting: Internal Medicine

## 2021-03-09 ENCOUNTER — Other Ambulatory Visit: Payer: Self-pay

## 2021-03-09 ENCOUNTER — Ambulatory Visit (INDEPENDENT_AMBULATORY_CARE_PROVIDER_SITE_OTHER): Payer: Medicare Other | Admitting: Internal Medicine

## 2021-03-09 VITALS — BP 124/72 | HR 58 | Resp 97 | Ht 59.0 in | Wt 94.6 lb

## 2021-03-09 DIAGNOSIS — R3129 Other microscopic hematuria: Secondary | ICD-10-CM | POA: Diagnosis not present

## 2021-03-09 DIAGNOSIS — I10 Essential (primary) hypertension: Secondary | ICD-10-CM

## 2021-03-09 DIAGNOSIS — R0989 Other specified symptoms and signs involving the circulatory and respiratory systems: Secondary | ICD-10-CM | POA: Diagnosis not present

## 2021-03-09 DIAGNOSIS — R350 Frequency of micturition: Secondary | ICD-10-CM

## 2021-03-09 DIAGNOSIS — M791 Myalgia, unspecified site: Secondary | ICD-10-CM | POA: Diagnosis not present

## 2021-03-09 DIAGNOSIS — E7800 Pure hypercholesterolemia, unspecified: Secondary | ICD-10-CM

## 2021-03-09 LAB — POCT URINALYSIS DIPSTICK
Bilirubin, UA: NEGATIVE
Glucose, UA: NEGATIVE
Ketones, UA: NEGATIVE
Leukocytes, UA: NEGATIVE
Nitrite, UA: NEGATIVE
Protein, UA: NEGATIVE
Spec Grav, UA: 1.015 (ref 1.010–1.025)
Urobilinogen, UA: 0.2 E.U./dL
pH, UA: 5 (ref 5.0–8.0)

## 2021-03-09 NOTE — Progress Notes (Signed)
Date:  03/09/2021   Name:  Tammy Mcgee   DOB:  06-24-1932   MRN:  [id number]   Chief Complaint: Hypertension and Hyperlipidemia  Hypertension This is a chronic problem. The current episode started more than 1 year ago. The problem is unchanged. The problem is controlled. Pertinent negatives include no chest pain, palpitations, shortness of breath or sweats. There are no known risk factors for coronary artery disease. Past treatments include angiotensin blockers. The current treatment provides significant improvement. There are no compliance problems.   Hyperlipidemia This is a chronic problem. The current episode started more than 1 year ago. The problem is uncontrolled. Associated symptoms include myalgias (both legs x 57mo- hard to get up from sitting). Pertinent negatives include no chest pain or shortness of breath. She is currently on no antihyperlipidemic treatment.  Urinary Frequency  This is a recurrent problem. The problem occurs intermittently. The patient is experiencing no pain. There has been no fever. Associated symptoms include frequency. Pertinent negatives include no chills, discharge, hematuria, hesitancy, nausea, sweats, urgency or vomiting. She has tried increased fluids for the symptoms.   Lab Results  Component Value Date   CREATININE 0.71 11/02/2020   BUN 16 11/02/2020   NA 140 11/02/2020   K 4.5 11/02/2020   CL 102 11/02/2020   CO2 24 11/02/2020   Lab Results  Component Value Date   CHOL 233 (H) 11/02/2020   HDL 78 11/02/2020   LDLCALC 140 (H) 11/02/2020   TRIG 87 11/02/2020   CHOLHDL 3.0 11/02/2020   Lab Results  Component Value Date   TSH 3.950 11/02/2020   No results found for: HGBA1C Lab Results  Component Value Date   WBC 6.8 11/02/2020   HGB 12.6 11/02/2020   HCT 37.9 11/02/2020   MCV 93 11/02/2020   PLT 202 11/02/2020   Lab Results  Component Value Date   ALT 8 11/02/2020   AST 19 11/02/2020   ALKPHOS 121 11/02/2020   BILITOT 0.5  11/02/2020     Review of Systems  Constitutional:  Negative for chills, fatigue and fever.  HENT:  Positive for hearing loss.        Functionally Deaf in both ears -  Respiratory:  Negative for cough, chest tightness and shortness of breath.   Cardiovascular:  Negative for chest pain, palpitations and leg swelling.  Gastrointestinal:  Negative for nausea and vomiting.  Genitourinary:  Positive for frequency. Negative for hematuria, hesitancy and urgency.  Musculoskeletal:  Positive for myalgias (both legs x 129mo hard to get up from sitting).  Psychiatric/Behavioral:  Negative for dysphoric mood and sleep disturbance. The patient is not nervous/anxious.    Patient Active Problem List   Diagnosis Date Noted   Left carotid bruit 11/02/2020   Acquired absence of one eye 04/10/2019   Primary osteoarthritis of right hand 09/05/2017   Bradycardia 04/08/2015   Central retinal vein occlusion 04/08/2015   Essential (primary) hypertension 04/08/2015   Gastro-esophageal reflux disease without esophagitis 04/08/2015   Glaucoma 04/08/2015   History of hearing problem 04/08/2015   Hypercholesterolemia 04/08/2015   History of colon cancer in adulthood 04/08/2015    No Known Allergies  Past Surgical History:  Procedure Laterality Date   APPENDECTOMY     CATARACT EXTRACTION W/ INTRAOCULAR LENS IMPLANT Right 06/17/2015   CERVIX LESION DESTRUCTION     ESOPHAGOGASTRODUODENOSCOPY  2004   [facility]   left eye removed due to glaucoma  PARTIAL COLECTOMY  2004   colon cancer    Social History   Tobacco Use   Smoking status: Never   Smokeless tobacco: Never   Tobacco comments:    smoking cessation materials not required  Vaping Use   Vaping Use: Never used  Substance Use Topics   Alcohol use: No    Alcohol/week: 0.0 standard drinks   Drug use: No     Medication list has been reviewed and updated.  Current Meds  Medication Sig   brimonidine-timolol (COMBIGAN)  0.2-0.5 % ophthalmic solution Apply to eye.   latanoprost (XALATAN) 0.005 % ophthalmic solution SMARTSIG:1 Drop(s) Right Eye Every Evening   losartan (COZAAR) 25 MG tablet Take 1 tablet (25 mg total) by mouth daily.   rosuvastatin (CRESTOR) 5 MG tablet Take 1 tablet (5 mg total) by mouth daily.    PHQ 2/9 Scores 03/09/2021 12/20/2020 11/02/2020 05/13/2020  PHQ - 2 Score 0 0 0 0  PHQ- 9 Score 6 - 2 0    GAD 7 : Generalized Anxiety Score 03/09/2021 11/02/2020 05/13/2020  Nervous, Anxious, on Edge 0 0 0  Control/stop worrying 0 0 0  Worry too much - different things 0 0 0  Trouble relaxing 0 0 0  Restless 0 0 0  Easily annoyed or irritable 0 0 0  Afraid - awful might happen 0 0 0  Total GAD 7 Score 0 0 0  Anxiety Difficulty Not difficult at all - -    BP Readings from Last 3 Encounters:  03/09/21 124/72  12/20/20 102/66  11/02/20 (!) 150/72    Physical Exam Vitals and nursing note reviewed.  Constitutional:      General: She is not in acute distress.    Appearance: Normal appearance. She is well-developed.  HENT:     Head: Normocephalic and atraumatic.  Neck:     Vascular: Carotid bruit (on left) present.  Cardiovascular:     Rate and Rhythm: Normal rate and regular rhythm.     Pulses: Normal pulses.     Heart sounds: No murmur heard. Pulmonary:     Effort: Pulmonary effort is normal. No respiratory distress.     Breath sounds: No wheezing or rhonchi.  Abdominal:     General: Bowel sounds are normal.     Tenderness: There is no abdominal tenderness. There is no right CVA tenderness or left CVA tenderness.  Musculoskeletal:        General: Tenderness (both thigh muscles) present.     Right lower leg: No edema.     Left lower leg: No edema.  Lymphadenopathy:     Cervical: No cervical adenopathy.  Skin:    General: Skin is warm and dry.     Findings: No rash.  Neurological:     Mental Status: She is alert and oriented to person, place, and time.  Psychiatric:         Mood and Affect: Mood normal.        Behavior: Behavior normal.    Wt Readings from Last 3 Encounters:  03/09/21 94 lb 9.6 oz (42.9 kg)  12/20/20 95 lb 3.2 oz (43.2 kg)  11/02/20 99 lb (44.9 kg)    BP 124/72   Pulse (!) 58   Resp (!) 97   Ht _0  (1.499 m)   Wt 94 lb 9.6 oz (42.9 kg)   BMI 19.11 kg/m   Assessment and Plan: 1. Essential (primary) hypertension Clinically stable exam with well controlled BP. Tolerating medications without  side effects at this time. Pt to continue current regimen and low sodium diet; benefits of regular exercise as able discussed.  2. Hypercholesterolemia Pt chose not to take medication - she changed her diet and wants to see if this helped.  If improved, will stay the course.  If unchanged or higher, she will start Crestor. - Lipid panel  3. Left carotid bruit Unchanged [place] was not very impressive but did suggest bilateral minimal plaque and possible developing distal left stenosis vs small vessel caliber  4. Myalgia Check ESR - Sedimentation rate  5. Urinary frequency UA unremarkable but micro + Will culture - POCT urinalysis dipstick   Partially dictated using Editor, commissioning. Any errors are unintentional.  Halina Maidens, MD [facility]  03/09/2021

## 2021-03-09 NOTE — Patient Instructions (Addendum)
Take a baby aspirin 81 mg every day to thin your blood slightly.

## 2021-03-10 LAB — LIPID PANEL
Chol/HDL Ratio: 2.7 ratio (ref 0.0–4.4)
Cholesterol, Total: 222 mg/dL — ABNORMAL HIGH (ref 100–199)
HDL: 83 mg/dL (ref >39)
LDL Chol Calc (NIH): 126 mg/dL — ABNORMAL HIGH (ref 0–99)
Triglycerides: 74 mg/dL (ref 0–149)
VLDL Cholesterol Cal: 13 mg/dL (ref 5–40)

## 2021-03-10 LAB — SEDIMENTATION RATE: Sed Rate: 4 mm/hr (ref 0–40)

## 2021-03-13 LAB — URINE CULTURE

## 2021-03-14 NOTE — Progress Notes (Signed)
Called pt with negative urine culture. Pt verbalized understanding.  KP

## 2021-03-28 DIAGNOSIS — D3131 Benign neoplasm of right choroid: Secondary | ICD-10-CM | POA: Diagnosis not present

## 2021-03-28 DIAGNOSIS — H401131 Primary open-angle glaucoma, bilateral, mild stage: Secondary | ICD-10-CM | POA: Diagnosis not present

## 2021-03-28 DIAGNOSIS — H43811 Vitreous degeneration, right eye: Secondary | ICD-10-CM | POA: Diagnosis not present

## 2021-03-28 DIAGNOSIS — H26491 Other secondary cataract, right eye: Secondary | ICD-10-CM | POA: Diagnosis not present

## 2021-04-25 ENCOUNTER — Ambulatory Visit: Payer: Self-pay | Admitting: Internal Medicine

## 2021-05-12 ENCOUNTER — Other Ambulatory Visit: Payer: Self-pay | Admitting: Internal Medicine

## 2021-05-12 DIAGNOSIS — I10 Essential (primary) hypertension: Secondary | ICD-10-CM

## 2021-05-14 NOTE — Telephone Encounter (Signed)
Requested medication (s) are due for refill today: yes  Requested medication (s) are on the active medication list: yes  Last refill:  07/27/20  Future visit scheduled: 12/21/21  Notes to clinic:  has not filled since 07/2020, Failed protocol of labs within 180 days, has upcoming appt, please assess.       Requested Prescriptions  Pending Prescriptions Disp Refills   losartan (COZAAR) 25 MG tablet [Pharmacy Med Name: Losartan Potassium 25 MG Oral Tablet] 90 tablet 0    Sig: Take 1 tablet by mouth once daily     Cardiovascular:  Angiotensin Receptor Blockers Failed - 05/12/2021  1:44 PM      Failed - Cr in normal range and within 180 days    Creatinine, Ser  Date Value Ref Range Status  11/02/2020 0.71 0.57 - 1.00 mg/dL Final          Failed - K in normal range and within 180 days    Potassium  Date Value Ref Range Status  11/02/2020 4.5 3.5 - 5.2 mmol/L Final          Passed - Patient is not pregnant      Passed - Last BP in normal range    BP Readings from Last 1 Encounters:  03/09/21 124/72          Passed - Valid encounter within last 6 months    Recent Outpatient Visits           2 months ago Essential (primary) hypertension   [facility] Glean Hess, MD   6 months ago Essential (primary) hypertension   [facility] Glean Hess, MD   1 year ago Essential (primary) hypertension   [facility] Glean Hess, MD   1 year ago Essential (primary) hypertension   [facility] Glean Hess, MD   2 years ago Essential (primary) hypertension   [facility] Glean Hess, MD

## 2021-05-24 ENCOUNTER — Other Ambulatory Visit: Payer: Self-pay | Admitting: Internal Medicine

## 2021-05-24 DIAGNOSIS — I10 Essential (primary) hypertension: Secondary | ICD-10-CM

## 2021-05-24 NOTE — Telephone Encounter (Signed)
.  automatic refill request sent. Filled 11/02/20 #90/3RF  Requested Prescriptions  Refused Prescriptions Disp Refills   losartan (COZAAR) 25 MG tablet [Pharmacy Med Name: Losartan Potassium 25 MG Oral Tablet] 90 tablet 0    Sig: Take 1 tablet by mouth once daily     Cardiovascular:  Angiotensin Receptor Blockers Failed - 05/24/2021 12:25 PM      Failed - Cr in normal range and within 180 days    Creatinine, Ser  Date Value Ref Range Status  11/02/2020 0.71 0.57 - 1.00 mg/dL Final         Failed - K in normal range and within 180 days    Potassium  Date Value Ref Range Status  11/02/2020 4.5 3.5 - 5.2 mmol/L Final         Passed - Patient is not pregnant      Passed - Last BP in normal range    BP Readings from Last 1 Encounters:  03/09/21 124/72         Passed - Valid encounter within last 6 months    Recent Outpatient Visits          2 months ago Essential (primary) hypertension   [facility] Glean Hess, MD   6 months ago Essential (primary) hypertension   [facility] Glean Hess, MD   1 year ago Essential (primary) hypertension   [facility] Glean Hess, MD   1 year ago Essential (primary) hypertension   [facility] Glean Hess, MD   2 years ago Essential (primary) hypertension   [facility] Glean Hess, MD

## 2021-12-07 ENCOUNTER — Other Ambulatory Visit: Payer: Self-pay | Admitting: Internal Medicine

## 2021-12-07 DIAGNOSIS — I10 Essential (primary) hypertension: Secondary | ICD-10-CM

## 2021-12-07 NOTE — Telephone Encounter (Signed)
Requested medication (s) are due for refill today: yes  Requested medication (s) are on the active medication list: yes  Last refill:  11/02/20 90 3 RF  Future visit scheduled: no  Notes to clinic:  called pt ans spoke with pt's daughter Adela Lank. Provided call back number and to make appt for refills.   Requested Prescriptions  Pending Prescriptions Disp Refills   losartan (COZAAR) 25 MG tablet [Pharmacy Med Name: Losartan Potassium 25 MG Oral Tablet] 90 tablet 0    Sig: Take 1 tablet by mouth once daily     Cardiovascular:  Angiotensin Receptor Blockers Failed - 12/07/2021  2:05 PM      Failed - Cr in normal range and within 180 days    Creatinine, Ser  Date Value Ref Range Status  11/02/2020 0.71 0.57 - 1.00 mg/dL Final         Failed - K in normal range and within 180 days    Potassium  Date Value Ref Range Status  11/02/2020 4.5 3.5 - 5.2 mmol/L Final         Failed - Valid encounter within last 6 months    Recent Outpatient Visits           9 months ago Essential (primary) hypertension   [facility] Glean Hess, MD   1 year ago Essential (primary) hypertension   [facility] Glean Hess, MD   1 year ago Essential (primary) hypertension   [facility] Glean Hess, MD   1 year ago Essential (primary) hypertension   [facility] Glean Hess, MD   2 years ago Essential (primary) hypertension   [place], [place]              Passed - Patient is not pregnant      Passed - Last BP in normal range    BP Readings from Last 1 Encounters:  03/09/21 124/72

## 2021-12-07 NOTE — Telephone Encounter (Signed)
Medication Refill - Medication: losartan (COZAAR) 25 MG tablet  Has the patient contacted their pharmacy? Yes.   (Agent: If no, request that the patient contact the pharmacy for the refill. If patient does not wish to contact the pharmacy document the reason why and proceed with request.) (Agent: If yes, when and what did the pharmacy advise?)  Preferred Pharmacy (with phone number or street name):  [place], [place] Phone:  [phone]  Fax:  [phone]     Has the patient been seen for an appointment in the last year OR does the patient have an upcoming appointment? Yes.    Agent: Please be advised that RX refills may take up to 3 business days. We ask that you follow-up with your pharmacy.

## 2021-12-07 NOTE — Telephone Encounter (Signed)
Requested medication (s) are due for refill today: yes  Requested medication (s) are on the active medication list: yes  Last refill:  11/02/20 #90/3  Future visit scheduled: yes  Notes to clinic:  Unable to refill per protocol due to failed labs, no updated results.      Requested Prescriptions  Pending Prescriptions Disp Refills   losartan (COZAAR) 25 MG tablet 90 tablet 3    Sig: Take 1 tablet (25 mg total) by mouth daily.     Cardiovascular:  Angiotensin Receptor Blockers Failed - 12/07/2021  3:40 PM      Failed - Cr in normal range and within 180 days    Creatinine, Ser  Date Value Ref Range Status  11/02/2020 0.71 0.57 - 1.00 mg/dL Final         Failed - K in normal range and within 180 days    Potassium  Date Value Ref Range Status  11/02/2020 4.5 3.5 - 5.2 mmol/L Final         Failed - Valid encounter within last 6 months    Recent Outpatient Visits           9 months ago Essential (primary) hypertension   [facility] Glean Hess, MD   1 year ago Essential (primary) hypertension   [facility] Glean Hess, MD   1 year ago Essential (primary) hypertension   [facility] Glean Hess, MD   1 year ago Essential (primary) hypertension   [facility] Glean Hess, MD   2 years ago Essential (primary) hypertension   [facility] Glean Hess, MD       Future Appointments             In 3 weeks Glean Hess, MD [facility] [place] - Patient is not pregnant      Passed - Last BP in normal range    BP Readings from Last 1 Encounters:  03/09/21 124/72

## 2021-12-21 ENCOUNTER — Encounter: Payer: Self-pay | Admitting: Family

## 2021-12-21 ENCOUNTER — Ambulatory Visit: Payer: Medicare Other | Admitting: Family

## 2021-12-21 NOTE — Progress Notes (Signed)
    Called patient multiple times for AWV. No answer or VM. I did finally reach her daughter, however, states her mother is in her garden and could not do the appointment for another 30-45 mins. I told her that her PCP's office would reach out and reschedule.   Evelina Dun, FNP

## 2022-01-02 ENCOUNTER — Encounter: Payer: Self-pay | Admitting: Internal Medicine

## 2022-01-02 ENCOUNTER — Ambulatory Visit (INDEPENDENT_AMBULATORY_CARE_PROVIDER_SITE_OTHER): Payer: Medicare PPO | Admitting: Internal Medicine

## 2022-01-02 VITALS — BP 118/70 | HR 58 | Ht 59.0 in | Wt 91.2 lb

## 2022-01-02 DIAGNOSIS — I10 Essential (primary) hypertension: Secondary | ICD-10-CM

## 2022-01-02 DIAGNOSIS — R0989 Other specified symptoms and signs involving the circulatory and respiratory systems: Secondary | ICD-10-CM | POA: Diagnosis not present

## 2022-01-02 DIAGNOSIS — E7800 Pure hypercholesterolemia, unspecified: Secondary | ICD-10-CM | POA: Diagnosis not present

## 2022-01-02 MED ORDER — LOSARTAN POTASSIUM 25 MG PO TABS: 25.0000 mg | ORAL_TABLET | Freq: Every day | ORAL | 5 refills | Status: DC

## 2022-01-02 NOTE — Progress Notes (Signed)
Date:  01/02/2022   Name:  Tammy Mcgee   DOB:  1933-02-09   MRN:  [id number]   Chief Complaint: Hypertension and Hyperlipidemia She reports doing well but can not drive far. She lives in [place] and is planning to get a PCP there.  Hypertension This is a chronic problem. The problem is controlled. Pertinent negatives include no chest pain, headaches, palpitations or shortness of breath. Past treatments include angiotensin blockers. The current treatment provides significant improvement. There is no history of kidney disease, CAD/MI or CVA. There is no history of chronic renal disease.  Hyperlipidemia This is a chronic problem. The problem is uncontrolled. Recent lipid tests were reviewed and are high. She has no history of chronic renal disease, diabetes or hypothyroidism. There are no known factors aggravating her hyperlipidemia. Pertinent negatives include no chest pain or shortness of breath. (Started last visit ) Current antihyperlipidemic treatment includes diet change (pt declined statin therapy).    Lab Results  Component Value Date   NA 140 11/02/2020   K 4.5 11/02/2020   CO2 24 11/02/2020   GLUCOSE 91 11/02/2020   BUN 16 11/02/2020   CREATININE 0.71 11/02/2020   CALCIUM 9.3 11/02/2020   EGFR 83 11/02/2020   GFRNONAA 70 01/08/2020   Lab Results  Component Value Date   CHOL 222 (H) 03/09/2021   HDL 83 03/09/2021   LDLCALC 126 (H) 03/09/2021   TRIG 74 03/09/2021   CHOLHDL 2.7 03/09/2021   Lab Results  Component Value Date   TSH 3.950 11/02/2020   No results found for: "HGBA1C" Lab Results  Component Value Date   WBC 6.8 11/02/2020   HGB 12.6 11/02/2020   HCT 37.9 11/02/2020   MCV 93 11/02/2020   PLT 202 11/02/2020   Lab Results  Component Value Date   ALT 8 11/02/2020   AST 19 11/02/2020   ALKPHOS 121 11/02/2020   BILITOT 0.5 11/02/2020   No results found for: "25OHVITD2", "25OHVITD3", "VD25OH"   Review of Systems  Constitutional:  Negative  for fatigue and unexpected weight change.  HENT:  Negative for nosebleeds.   Eyes:  Negative for visual disturbance.  Respiratory:  Negative for cough, chest tightness, shortness of breath and wheezing.   Cardiovascular:  Negative for chest pain, palpitations and leg swelling.  Gastrointestinal:  Negative for abdominal pain, constipation and diarrhea.  Neurological:  Negative for dizziness, weakness, light-headedness and headaches.    Patient Active Problem List   Diagnosis Date Noted   Left carotid bruit 11/02/2020   Acquired absence of one eye 04/10/2019   Primary osteoarthritis of right hand 09/05/2017   Bradycardia 04/08/2015   Central retinal vein occlusion 04/08/2015   Essential (primary) hypertension 04/08/2015   Gastro-esophageal reflux disease without esophagitis 04/08/2015   Glaucoma 04/08/2015   History of hearing problem 04/08/2015   Hypercholesterolemia 04/08/2015   History of colon cancer in adulthood 04/08/2015    No Known Allergies  Past Surgical History:  Procedure Laterality Date   APPENDECTOMY     CATARACT EXTRACTION W/ INTRAOCULAR LENS IMPLANT Right 06/17/2015   CERVIX LESION DESTRUCTION     ESOPHAGOGASTRODUODENOSCOPY  2004   [facility]   EYE SURGERY  11/18/2018   left eye removed due to glaucoma   PARTIAL COLECTOMY  2004   colon cancer    Social History   Tobacco Use   Smoking status: Never   Smokeless tobacco: Never   Tobacco comments:    smoking cessation materials not required  Vaping  Use   Vaping Use: Never used  Substance Use Topics   Alcohol use: No    Alcohol/week: 0.0 standard drinks of alcohol   Drug use: No     Medication list has been reviewed and updated.  Current Meds  Medication Sig   brimonidine-timolol (COMBIGAN) 0.2-0.5 % ophthalmic solution Apply to eye.   latanoprost (XALATAN) 0.005 % ophthalmic solution SMARTSIG:1 Drop(s) Right Eye Every Evening   [DISCONTINUED] losartan (COZAAR) 25 MG tablet Take 1 tablet by mouth once  daily   [DISCONTINUED] rosuvastatin (CRESTOR) 5 MG tablet Take 1 tablet (5 mg total) by mouth daily.       01/02/2022    2:36 PM 03/09/2021    9:30 AM 11/02/2020    9:37 AM 05/13/2020    9:31 AM  GAD 7 : Generalized Anxiety Score  Nervous, Anxious, on Edge 0 0 0 0  Control/stop worrying 0 0 0 0  Worry too much - different things 0 0 0 0  Trouble relaxing 0 0 0 0  Restless 0 0 0 0  Easily annoyed or irritable 0 0 0 0  Afraid - awful might happen 0 0 0 0  Total GAD 7 Score 0 0 0 0  Anxiety Difficulty Not difficult at all Not difficult at all         01/02/2022    2:36 PM 03/09/2021    9:29 AM 12/20/2020   11:39 AM  Depression screen PHQ 2/9  Decreased Interest 0 0 0  Down, Depressed, Hopeless 0 0 0  PHQ - 2 Score 0 0 0  Altered sleeping 3 3   Tired, decreased energy 0 3   Change in appetite 0 0   Feeling bad or failure about yourself  0 0   Trouble concentrating 0 0   Moving slowly or fidgety/restless 0 0   Suicidal thoughts 0 0   PHQ-9 Score 3 6   Difficult doing work/chores Not difficult at all Not difficult at all     BP Readings from Last 3 Encounters:  01/02/22 118/70  03/09/21 124/72  12/20/20 102/66    Physical Exam Vitals and nursing note reviewed.  Constitutional:      General: She is not in acute distress.    Appearance: She is well-developed.  HENT:     Head: Normocephalic and atraumatic.  Pulmonary:     Effort: Pulmonary effort is normal. No respiratory distress.  Skin:    General: Skin is warm and dry.     Findings: No rash.  Neurological:     Mental Status: She is alert and oriented to person, place, and time.  Psychiatric:        Mood and Affect: Mood normal.        Behavior: Behavior normal.     Wt Readings from Last 3 Encounters:  01/02/22 91 lb 3.2 oz (41.4 kg)  03/09/21 94 lb 9.6 oz (42.9 kg)  12/20/20 95 lb 3.2 oz (43.2 kg)    BP 118/70 (BP Location: Left Arm, Cuff Size: Normal)   Pulse (!) 58   Ht $R'4\' 11"'vI$  (1.499 m)   Wt 91 lb 3.2  oz (41.4 kg)   SpO2 95%   BMI 18.42 kg/m   Assessment and Plan: 1. Essential (primary) hypertension Clinically stable exam with well controlled BP. Tolerating medications without side effects at this time. Pt to continue current regimen and low sodium diet; benefits of regular exercise as able discussed. Recommend 6 mo follow up if she does  not find another PCP - CBC with Differential/Platelet - Comprehensive metabolic panel - TSH - losartan (COZAAR) 25 MG tablet; Take 1 tablet (25 mg total) by mouth daily.  Dispense: 30 tablet; Refill: 5  2. Hypercholesterolemia She has tried to make diet changes. She is not really interested in statin therapy - Lipid panel  3. Left carotid bruit Encourage low fat diet since she does not want to take a statin. Carotid US showed no hemodynamically significant stenosis in 2022 - Lipid panel   Partially dictated using Dragon software. Any errors are unintentional.  Halina Maidens, MD [facility]  01/02/2022

## 2022-01-03 LAB — COMPREHENSIVE METABOLIC PANEL
ALT: 9 IU/L (ref 0–32)
AST: 17 IU/L (ref 0–40)
Albumin/Globulin Ratio: 1.7 (ref 1.2–2.2)
Albumin: 4.2 g/dL (ref 3.7–4.7)
Alkaline Phosphatase: 108 IU/L (ref 44–121)
BUN/Creatinine Ratio: 19 (ref 12–28)
BUN: 17 mg/dL (ref 8–27)
Bilirubin Total: 0.3 mg/dL (ref 0.0–1.2)
CO2: 24 mmol/L (ref 20–29)
Calcium: 9.8 mg/dL (ref 8.7–10.3)
Chloride: 102 mmol/L (ref 96–106)
Creatinine, Ser: 0.88 mg/dL (ref 0.57–1.00)
Globulin, Total: 2.5 g/dL (ref 1.5–4.5)
Glucose: 133 mg/dL — ABNORMAL HIGH (ref 70–99)
Potassium: 4.5 mmol/L (ref 3.5–5.2)
Sodium: 141 mmol/L (ref 134–144)
Total Protein: 6.7 g/dL (ref 6.0–8.5)
eGFR: 64 mL/min/{1.73_m2} (ref >59)

## 2022-01-03 LAB — CBC WITH DIFFERENTIAL/PLATELET
Basophils Absolute: 0 10*3/uL (ref 0.0–0.2)
Basos: 0 %
EOS (ABSOLUTE): 0.1 10*3/uL (ref 0.0–0.4)
Eos: 2 %
Hematocrit: 34.4 % (ref 34.0–46.6)
Hemoglobin: 11.6 g/dL (ref 11.1–15.9)
Immature Grans (Abs): 0 10*3/uL (ref 0.0–0.1)
Immature Granulocytes: 0 %
Lymphocytes Absolute: 1.2 10*3/uL (ref 0.7–3.1)
Lymphs: 22 %
MCH: 31.1 pg (ref 26.6–33.0)
MCHC: 33.7 g/dL (ref 31.5–35.7)
MCV: 92 fL (ref 79–97)
Monocytes Absolute: 0.5 10*3/uL (ref 0.1–0.9)
Monocytes: 9 %
Neutrophils Absolute: 3.6 10*3/uL (ref 1.4–7.0)
Neutrophils: 67 %
Platelets: 200 10*3/uL (ref 150–450)
RBC: 3.73 x10E6/uL — ABNORMAL LOW (ref 3.77–5.28)
RDW: 12.3 % (ref 11.7–15.4)
WBC: 5.4 10*3/uL (ref 3.4–10.8)

## 2022-01-03 LAB — LIPID PANEL
Chol/HDL Ratio: 2.8 ratio (ref 0.0–4.4)
Cholesterol, Total: 204 mg/dL — ABNORMAL HIGH (ref 100–199)
HDL: 74 mg/dL (ref >39)
LDL Chol Calc (NIH): 109 mg/dL — ABNORMAL HIGH (ref 0–99)
Triglycerides: 122 mg/dL (ref 0–149)
VLDL Cholesterol Cal: 21 mg/dL (ref 5–40)

## 2022-01-03 LAB — TSH: TSH: 2.59 u[IU]/mL (ref 0.450–4.500)

## 2022-01-23 ENCOUNTER — Telehealth: Payer: Self-pay | Admitting: Internal Medicine

## 2022-01-23 NOTE — Telephone Encounter (Signed)
Copied from [place] [phone] [phone]. Topic: Medicare AWV >> Jan 23, 2022 12:08 PM Jae Dire wrote: Reason for CRM:  Left message for patient to call back and schedule Medicare Annual Wellness Visit (AWV) in office.   If unable to come into the office for AWV,  please offer to do virtually or by telephone.  Last AWV: 12/20/2020   Please schedule at anytime with [facility] Health Advisor.      30 minute appointment for Virtual or phone 45 minute appointment for in office or Initial virtual/phone  Any questions, please call me at [phone]

## 2022-03-23 DIAGNOSIS — H04123 Dry eye syndrome of bilateral lacrimal glands: Secondary | ICD-10-CM | POA: Diagnosis not present

## 2022-03-23 DIAGNOSIS — H26491 Other secondary cataract, right eye: Secondary | ICD-10-CM | POA: Diagnosis not present

## 2022-03-23 DIAGNOSIS — D3131 Benign neoplasm of right choroid: Secondary | ICD-10-CM | POA: Diagnosis not present

## 2022-03-23 DIAGNOSIS — H401131 Primary open-angle glaucoma, bilateral, mild stage: Secondary | ICD-10-CM | POA: Diagnosis not present

## 2022-04-19 DIAGNOSIS — Z85828 Personal history of other malignant neoplasm of skin: Secondary | ICD-10-CM | POA: Diagnosis not present

## 2022-04-19 DIAGNOSIS — Z08 Encounter for follow-up examination after completed treatment for malignant neoplasm: Secondary | ICD-10-CM | POA: Diagnosis not present

## 2022-04-19 DIAGNOSIS — L821 Other seborrheic keratosis: Secondary | ICD-10-CM | POA: Diagnosis not present

## 2022-04-19 DIAGNOSIS — X32XXXA Exposure to sunlight, initial encounter: Secondary | ICD-10-CM | POA: Diagnosis not present

## 2022-04-19 DIAGNOSIS — L814 Other melanin hyperpigmentation: Secondary | ICD-10-CM | POA: Diagnosis not present

## 2022-04-19 DIAGNOSIS — L578 Other skin changes due to chronic exposure to nonionizing radiation: Secondary | ICD-10-CM | POA: Diagnosis not present

## 2022-04-19 DIAGNOSIS — L570 Actinic keratosis: Secondary | ICD-10-CM | POA: Diagnosis not present

## 2022-04-26 DIAGNOSIS — D3131 Benign neoplasm of right choroid: Secondary | ICD-10-CM | POA: Diagnosis not present

## 2022-04-26 DIAGNOSIS — H401131 Primary open-angle glaucoma, bilateral, mild stage: Secondary | ICD-10-CM | POA: Diagnosis not present

## 2022-04-26 DIAGNOSIS — H04123 Dry eye syndrome of bilateral lacrimal glands: Secondary | ICD-10-CM | POA: Diagnosis not present

## 2022-04-26 DIAGNOSIS — H26491 Other secondary cataract, right eye: Secondary | ICD-10-CM | POA: Diagnosis not present

## 2022-06-20 ENCOUNTER — Telehealth: Payer: Self-pay | Admitting: Internal Medicine

## 2022-06-20 NOTE — Telephone Encounter (Signed)
Patient stated she see other provider and declined AWV

## 2022-07-17 DIAGNOSIS — K219 Gastro-esophageal reflux disease without esophagitis: Secondary | ICD-10-CM | POA: Diagnosis not present

## 2022-07-17 DIAGNOSIS — H409 Unspecified glaucoma: Secondary | ICD-10-CM | POA: Diagnosis not present

## 2022-07-17 DIAGNOSIS — M25511 Pain in right shoulder: Secondary | ICD-10-CM | POA: Diagnosis not present

## 2022-07-17 DIAGNOSIS — S0081XA Abrasion of other part of head, initial encounter: Secondary | ICD-10-CM | POA: Diagnosis not present

## 2022-07-17 DIAGNOSIS — S0990XA Unspecified injury of head, initial encounter: Secondary | ICD-10-CM | POA: Diagnosis not present

## 2022-07-17 DIAGNOSIS — S42214A Unspecified nondisplaced fracture of surgical neck of right humerus, initial encounter for closed fracture: Secondary | ICD-10-CM | POA: Diagnosis not present

## 2022-07-17 DIAGNOSIS — M19041 Primary osteoarthritis, right hand: Secondary | ICD-10-CM | POA: Diagnosis not present

## 2022-07-17 DIAGNOSIS — S42211A Unspecified displaced fracture of surgical neck of right humerus, initial encounter for closed fracture: Secondary | ICD-10-CM | POA: Diagnosis not present

## 2022-07-17 DIAGNOSIS — C189 Malignant neoplasm of colon, unspecified: Secondary | ICD-10-CM | POA: Diagnosis not present

## 2022-07-17 DIAGNOSIS — I10 Essential (primary) hypertension: Secondary | ICD-10-CM | POA: Diagnosis not present

## 2022-07-17 DIAGNOSIS — M79601 Pain in right arm: Secondary | ICD-10-CM | POA: Diagnosis not present

## 2022-07-17 DIAGNOSIS — R609 Edema, unspecified: Secondary | ICD-10-CM | POA: Diagnosis not present

## 2022-07-17 DIAGNOSIS — E785 Hyperlipidemia, unspecified: Secondary | ICD-10-CM | POA: Diagnosis not present

## 2022-07-20 ENCOUNTER — Other Ambulatory Visit: Payer: Self-pay | Admitting: Internal Medicine

## 2022-07-20 DIAGNOSIS — I10 Essential (primary) hypertension: Secondary | ICD-10-CM

## 2022-08-03 DIAGNOSIS — H26491 Other secondary cataract, right eye: Secondary | ICD-10-CM | POA: Diagnosis not present

## 2022-08-03 DIAGNOSIS — H04123 Dry eye syndrome of bilateral lacrimal glands: Secondary | ICD-10-CM | POA: Diagnosis not present

## 2022-08-03 DIAGNOSIS — D3131 Benign neoplasm of right choroid: Secondary | ICD-10-CM | POA: Diagnosis not present

## 2022-08-03 DIAGNOSIS — H401131 Primary open-angle glaucoma, bilateral, mild stage: Secondary | ICD-10-CM | POA: Diagnosis not present

## 2022-08-07 DIAGNOSIS — S42211D Unspecified displaced fracture of surgical neck of right humerus, subsequent encounter for fracture with routine healing: Secondary | ICD-10-CM | POA: Diagnosis not present

## 2022-08-07 DIAGNOSIS — S42201A Unspecified fracture of upper end of right humerus, initial encounter for closed fracture: Secondary | ICD-10-CM | POA: Diagnosis not present

## 2022-08-07 DIAGNOSIS — M19011 Primary osteoarthritis, right shoulder: Secondary | ICD-10-CM | POA: Diagnosis not present

## 2022-08-07 DIAGNOSIS — M85811 Other specified disorders of bone density and structure, right shoulder: Secondary | ICD-10-CM | POA: Diagnosis not present

## 2022-08-07 DIAGNOSIS — S42251D Displaced fracture of greater tuberosity of right humerus, subsequent encounter for fracture with routine healing: Secondary | ICD-10-CM | POA: Diagnosis not present

## 2022-08-08 DIAGNOSIS — S42214D Unspecified nondisplaced fracture of surgical neck of right humerus, subsequent encounter for fracture with routine healing: Secondary | ICD-10-CM | POA: Diagnosis not present

## 2022-08-08 DIAGNOSIS — H9193 Unspecified hearing loss, bilateral: Secondary | ICD-10-CM | POA: Diagnosis not present

## 2022-08-08 DIAGNOSIS — I10 Essential (primary) hypertension: Secondary | ICD-10-CM | POA: Diagnosis not present

## 2022-09-04 ENCOUNTER — Other Ambulatory Visit: Payer: Self-pay | Admitting: Internal Medicine

## 2022-09-04 DIAGNOSIS — S42201D Unspecified fracture of upper end of right humerus, subsequent encounter for fracture with routine healing: Secondary | ICD-10-CM | POA: Diagnosis not present

## 2022-09-04 DIAGNOSIS — S42201A Unspecified fracture of upper end of right humerus, initial encounter for closed fracture: Secondary | ICD-10-CM | POA: Diagnosis not present

## 2022-09-04 DIAGNOSIS — S42211D Unspecified displaced fracture of surgical neck of right humerus, subsequent encounter for fracture with routine healing: Secondary | ICD-10-CM | POA: Diagnosis not present

## 2022-09-04 DIAGNOSIS — I10 Essential (primary) hypertension: Secondary | ICD-10-CM

## 2022-09-05 NOTE — Telephone Encounter (Signed)
Requested medication (s) are due for refill today: yes  Requested medication (s) are on the active medication list: yes  Last refill:  07/20/22 #30/0  Future visit scheduled: no  Notes to clinic:  Unable to refill per protocol due to failed labs, no updated results.      Requested Prescriptions  Pending Prescriptions Disp Refills   losartan (COZAAR) 25 MG tablet [Pharmacy Med Name: Losartan Potassium 25 MG Oral Tablet] 90 tablet 0    Sig: Take 1 tablet by mouth once daily     Cardiovascular:  Angiotensin Receptor Blockers Failed - 09/04/2022  9:20 AM      Failed - Cr in normal range and within 180 days    Creatinine, Ser  Date Value Ref Range Status  01/02/2022 0.88 0.57 - 1.00 mg/dL Final         Failed - K in normal range and within 180 days    Potassium  Date Value Ref Range Status  01/02/2022 4.5 3.5 - 5.2 mmol/L Final         Failed - Valid encounter within last 6 months    Recent Outpatient Visits           8 months ago Essential (primary) hypertension   [facility], Jesse Sans, MD   1 year ago Essential (primary) hypertension   [facility], Jesse Sans, MD   1 year ago Essential (primary) hypertension   [place] [facility], Jesse Sans, MD   2 years ago Essential (primary) hypertension   [place] [facility], Jesse Sans, MD   2 years ago Essential (primary) hypertension   [place] [facility], Jesse Sans, MD              Passed - Patient is not pregnant      Passed - Last BP in normal range    BP Readings from Last 1 Encounters:  01/02/22 118/70

## 2022-09-08 DIAGNOSIS — H401131 Primary open-angle glaucoma, bilateral, mild stage: Secondary | ICD-10-CM | POA: Diagnosis not present

## 2022-09-08 DIAGNOSIS — H04123 Dry eye syndrome of bilateral lacrimal glands: Secondary | ICD-10-CM | POA: Diagnosis not present

## 2022-09-08 DIAGNOSIS — H26491 Other secondary cataract, right eye: Secondary | ICD-10-CM | POA: Diagnosis not present

## 2022-09-08 DIAGNOSIS — D3131 Benign neoplasm of right choroid: Secondary | ICD-10-CM | POA: Diagnosis not present

## 2022-09-12 DIAGNOSIS — H26491 Other secondary cataract, right eye: Secondary | ICD-10-CM | POA: Diagnosis not present

## 2022-09-12 DIAGNOSIS — H401131 Primary open-angle glaucoma, bilateral, mild stage: Secondary | ICD-10-CM | POA: Diagnosis not present

## 2022-09-12 DIAGNOSIS — D3131 Benign neoplasm of right choroid: Secondary | ICD-10-CM | POA: Diagnosis not present

## 2022-09-12 DIAGNOSIS — H04123 Dry eye syndrome of bilateral lacrimal glands: Secondary | ICD-10-CM | POA: Diagnosis not present

## 2022-09-22 DIAGNOSIS — S42201D Unspecified fracture of upper end of right humerus, subsequent encounter for fracture with routine healing: Secondary | ICD-10-CM | POA: Diagnosis not present

## 2022-09-25 ENCOUNTER — Telehealth: Payer: Self-pay | Admitting: Internal Medicine

## 2022-09-25 NOTE — Telephone Encounter (Signed)
Copied from CRM [phone]. Topic: Medicare AWV >> Sep 25, 2022  3:16 PM Payton Doughty wrote: Reason for CRM: Called patient to schedule Medicare Annual Wellness Visit (AWV). No voicemail available to leave a message.  Last date of AWV: 12/20/2020   Please schedule an appointment at any time with Kennedy Bucker, LPN  .  If any questions, please contact me.  Thank you ,  Verlee Rossetti; Care Guide Ambulatory Clinical Support [place] [facility] Direct Dial: [phone]

## 2022-10-06 DIAGNOSIS — S42201D Unspecified fracture of upper end of right humerus, subsequent encounter for fracture with routine healing: Secondary | ICD-10-CM | POA: Diagnosis not present

## 2022-10-09 DIAGNOSIS — S42201D Unspecified fracture of upper end of right humerus, subsequent encounter for fracture with routine healing: Secondary | ICD-10-CM | POA: Diagnosis not present

## 2022-10-09 DIAGNOSIS — S42351D Displaced comminuted fracture of shaft of humerus, right arm, subsequent encounter for fracture with routine healing: Secondary | ICD-10-CM | POA: Diagnosis not present

## 2022-10-13 DIAGNOSIS — S42201D Unspecified fracture of upper end of right humerus, subsequent encounter for fracture with routine healing: Secondary | ICD-10-CM | POA: Diagnosis not present

## 2022-10-18 DIAGNOSIS — L814 Other melanin hyperpigmentation: Secondary | ICD-10-CM | POA: Diagnosis not present

## 2022-10-18 DIAGNOSIS — L820 Inflamed seborrheic keratosis: Secondary | ICD-10-CM | POA: Diagnosis not present

## 2022-10-18 DIAGNOSIS — L298 Other pruritus: Secondary | ICD-10-CM | POA: Diagnosis not present

## 2022-10-18 DIAGNOSIS — Z85828 Personal history of other malignant neoplasm of skin: Secondary | ICD-10-CM | POA: Diagnosis not present

## 2022-10-18 DIAGNOSIS — L570 Actinic keratosis: Secondary | ICD-10-CM | POA: Diagnosis not present

## 2022-10-18 DIAGNOSIS — R208 Other disturbances of skin sensation: Secondary | ICD-10-CM | POA: Diagnosis not present

## 2022-10-18 DIAGNOSIS — D225 Melanocytic nevi of trunk: Secondary | ICD-10-CM | POA: Diagnosis not present

## 2022-10-18 DIAGNOSIS — L821 Other seborrheic keratosis: Secondary | ICD-10-CM | POA: Diagnosis not present

## 2022-10-18 DIAGNOSIS — Z08 Encounter for follow-up examination after completed treatment for malignant neoplasm: Secondary | ICD-10-CM | POA: Diagnosis not present

## 2022-10-27 DIAGNOSIS — S42201D Unspecified fracture of upper end of right humerus, subsequent encounter for fracture with routine healing: Secondary | ICD-10-CM | POA: Diagnosis not present

## 2022-11-03 DIAGNOSIS — S42201D Unspecified fracture of upper end of right humerus, subsequent encounter for fracture with routine healing: Secondary | ICD-10-CM | POA: Diagnosis not present

## 2022-11-10 DIAGNOSIS — R011 Cardiac murmur, unspecified: Secondary | ICD-10-CM | POA: Diagnosis not present

## 2022-11-10 DIAGNOSIS — I10 Essential (primary) hypertension: Secondary | ICD-10-CM | POA: Diagnosis not present

## 2022-11-10 DIAGNOSIS — Z780 Asymptomatic menopausal state: Secondary | ICD-10-CM | POA: Diagnosis not present

## 2022-11-10 DIAGNOSIS — R0602 Shortness of breath: Secondary | ICD-10-CM | POA: Diagnosis not present

## 2022-11-10 DIAGNOSIS — E559 Vitamin D deficiency, unspecified: Secondary | ICD-10-CM | POA: Diagnosis not present

## 2022-11-10 DIAGNOSIS — Z2821 Immunization not carried out because of patient refusal: Secondary | ICD-10-CM | POA: Diagnosis not present

## 2022-11-10 DIAGNOSIS — Z0000 Encounter for general adult medical examination without abnormal findings: Secondary | ICD-10-CM | POA: Diagnosis not present

## 2022-11-10 DIAGNOSIS — R7301 Impaired fasting glucose: Secondary | ICD-10-CM | POA: Diagnosis not present

## 2022-11-10 DIAGNOSIS — Z23 Encounter for immunization: Secondary | ICD-10-CM | POA: Diagnosis not present

## 2022-11-15 DIAGNOSIS — S42201D Unspecified fracture of upper end of right humerus, subsequent encounter for fracture with routine healing: Secondary | ICD-10-CM | POA: Diagnosis not present

## 2022-11-16 DIAGNOSIS — H903 Sensorineural hearing loss, bilateral: Secondary | ICD-10-CM | POA: Diagnosis not present

## 2022-12-11 DIAGNOSIS — S42201D Unspecified fracture of upper end of right humerus, subsequent encounter for fracture with routine healing: Secondary | ICD-10-CM | POA: Diagnosis not present

## 2022-12-11 DIAGNOSIS — S42351D Displaced comminuted fracture of shaft of humerus, right arm, subsequent encounter for fracture with routine healing: Secondary | ICD-10-CM | POA: Diagnosis not present

## 2022-12-12 DIAGNOSIS — H401131 Primary open-angle glaucoma, bilateral, mild stage: Secondary | ICD-10-CM | POA: Diagnosis not present

## 2022-12-12 DIAGNOSIS — D3131 Benign neoplasm of right choroid: Secondary | ICD-10-CM | POA: Diagnosis not present

## 2022-12-12 DIAGNOSIS — H04123 Dry eye syndrome of bilateral lacrimal glands: Secondary | ICD-10-CM | POA: Diagnosis not present

## 2022-12-12 DIAGNOSIS — H26491 Other secondary cataract, right eye: Secondary | ICD-10-CM | POA: Diagnosis not present

## 2022-12-18 DIAGNOSIS — R0602 Shortness of breath: Secondary | ICD-10-CM | POA: Diagnosis not present

## 2022-12-18 DIAGNOSIS — I10 Essential (primary) hypertension: Secondary | ICD-10-CM | POA: Diagnosis not present

## 2022-12-18 DIAGNOSIS — R011 Cardiac murmur, unspecified: Secondary | ICD-10-CM | POA: Diagnosis not present

## 2023-03-20 DIAGNOSIS — I10 Essential (primary) hypertension: Secondary | ICD-10-CM | POA: Diagnosis not present

## 2023-03-20 DIAGNOSIS — E441 Mild protein-calorie malnutrition: Secondary | ICD-10-CM | POA: Diagnosis not present

## 2023-03-20 DIAGNOSIS — S42201D Unspecified fracture of upper end of right humerus, subsequent encounter for fracture with routine healing: Secondary | ICD-10-CM | POA: Diagnosis not present

## 2023-04-03 DIAGNOSIS — R0602 Shortness of breath: Secondary | ICD-10-CM | POA: Diagnosis not present

## 2023-04-03 DIAGNOSIS — I10 Essential (primary) hypertension: Secondary | ICD-10-CM | POA: Diagnosis not present

## 2023-04-23 DIAGNOSIS — L570 Actinic keratosis: Secondary | ICD-10-CM | POA: Diagnosis not present

## 2023-04-23 DIAGNOSIS — Z85828 Personal history of other malignant neoplasm of skin: Secondary | ICD-10-CM | POA: Diagnosis not present

## 2023-04-23 DIAGNOSIS — Z08 Encounter for follow-up examination after completed treatment for malignant neoplasm: Secondary | ICD-10-CM | POA: Diagnosis not present

## 2023-04-23 DIAGNOSIS — L814 Other melanin hyperpigmentation: Secondary | ICD-10-CM | POA: Diagnosis not present

## 2023-04-23 DIAGNOSIS — L821 Other seborrheic keratosis: Secondary | ICD-10-CM | POA: Diagnosis not present

## 2023-04-23 DIAGNOSIS — D225 Melanocytic nevi of trunk: Secondary | ICD-10-CM | POA: Diagnosis not present

## 2023-04-30 DIAGNOSIS — H26491 Other secondary cataract, right eye: Secondary | ICD-10-CM | POA: Diagnosis not present

## 2023-04-30 DIAGNOSIS — D3131 Benign neoplasm of right choroid: Secondary | ICD-10-CM | POA: Diagnosis not present

## 2023-04-30 DIAGNOSIS — H401131 Primary open-angle glaucoma, bilateral, mild stage: Secondary | ICD-10-CM | POA: Diagnosis not present

## 2023-04-30 DIAGNOSIS — H04123 Dry eye syndrome of bilateral lacrimal glands: Secondary | ICD-10-CM | POA: Diagnosis not present
